# Patient Record
Sex: Female | Born: 1935 | Race: White | Hispanic: No | Smoking: Never smoker
Health system: Southern US, Community
[De-identification: ages and names within clinical notes are randomized; demographics above are authoritative.]

## PROBLEM LIST (undated history)

## (undated) DIAGNOSIS — D649 Anemia, unspecified: Secondary | ICD-10-CM

## (undated) DIAGNOSIS — I10 Essential (primary) hypertension: Secondary | ICD-10-CM

## (undated) DIAGNOSIS — I739 Peripheral vascular disease, unspecified: Secondary | ICD-10-CM

## (undated) DIAGNOSIS — C50919 Malignant neoplasm of unspecified site of unspecified female breast: Secondary | ICD-10-CM

## (undated) DIAGNOSIS — I509 Heart failure, unspecified: Secondary | ICD-10-CM

## (undated) DIAGNOSIS — C801 Malignant (primary) neoplasm, unspecified: Secondary | ICD-10-CM

## (undated) HISTORY — PX: REPLACEMENT TOTAL KNEE BILATERAL: SUR1225

## (undated) HISTORY — DX: Peripheral vascular disease, unspecified: I73.9

## (undated) HISTORY — PX: BREAST LUMPECTOMY: SHX2

## (undated) HISTORY — DX: Anemia, unspecified: D64.9

---

## 2016-06-22 ENCOUNTER — Encounter (HOSPITAL_COMMUNITY): Payer: Self-pay | Admitting: Emergency Medicine

## 2016-06-22 ENCOUNTER — Emergency Department (HOSPITAL_COMMUNITY): Payer: Medicare Other

## 2016-06-22 ENCOUNTER — Inpatient Hospital Stay (HOSPITAL_COMMUNITY)
Admission: EM | Admit: 2016-06-22 | Discharge: 2016-06-27 | DRG: 689 | Disposition: A | Discharge status: Home / Self Care | Payer: Medicare Other | Attending: Family Medicine | Admitting: Family Medicine

## 2016-06-22 DIAGNOSIS — M9711XD Periprosthetic fracture around internal prosthetic right knee joint, subsequent encounter: Secondary | ICD-10-CM | POA: Diagnosis not present

## 2016-06-22 DIAGNOSIS — Z8679 Personal history of other diseases of the circulatory system: Secondary | ICD-10-CM

## 2016-06-22 DIAGNOSIS — D649 Anemia, unspecified: Secondary | ICD-10-CM

## 2016-06-22 DIAGNOSIS — I5030 Unspecified diastolic (congestive) heart failure: Secondary | ICD-10-CM | POA: Diagnosis present

## 2016-06-22 DIAGNOSIS — Z96653 Presence of artificial knee joint, bilateral: Secondary | ICD-10-CM | POA: Diagnosis present

## 2016-06-22 DIAGNOSIS — B965 Pseudomonas (aeruginosa) (mallei) (pseudomallei) as the cause of diseases classified elsewhere: Secondary | ICD-10-CM | POA: Diagnosis present

## 2016-06-22 DIAGNOSIS — L89610 Pressure ulcer of right heel, unstageable: Secondary | ICD-10-CM | POA: Diagnosis present

## 2016-06-22 DIAGNOSIS — R0602 Shortness of breath: Secondary | ICD-10-CM

## 2016-06-22 DIAGNOSIS — I10 Essential (primary) hypertension: Secondary | ICD-10-CM | POA: Diagnosis present

## 2016-06-22 DIAGNOSIS — Z7983 Long term (current) use of bisphosphonates: Secondary | ICD-10-CM | POA: Diagnosis not present

## 2016-06-22 DIAGNOSIS — I82409 Acute embolism and thrombosis of unspecified deep veins of unspecified lower extremity: Secondary | ICD-10-CM | POA: Diagnosis present

## 2016-06-22 DIAGNOSIS — Z7982 Long term (current) use of aspirin: Secondary | ICD-10-CM | POA: Diagnosis not present

## 2016-06-22 DIAGNOSIS — Z882 Allergy status to sulfonamides status: Secondary | ICD-10-CM

## 2016-06-22 DIAGNOSIS — Y92129 Unspecified place in nursing home as the place of occurrence of the external cause: Secondary | ICD-10-CM | POA: Diagnosis not present

## 2016-06-22 DIAGNOSIS — T404X5A Adverse effect of other synthetic narcotics, initial encounter: Secondary | ICD-10-CM | POA: Diagnosis present

## 2016-06-22 DIAGNOSIS — S7291XA Unspecified fracture of right femur, initial encounter for closed fracture: Secondary | ICD-10-CM | POA: Diagnosis present

## 2016-06-22 DIAGNOSIS — Z86718 Personal history of other venous thrombosis and embolism: Secondary | ICD-10-CM | POA: Diagnosis not present

## 2016-06-22 DIAGNOSIS — N3000 Acute cystitis without hematuria: Secondary | ICD-10-CM | POA: Diagnosis not present

## 2016-06-22 DIAGNOSIS — L89893 Pressure ulcer of other site, stage 3: Secondary | ICD-10-CM | POA: Diagnosis present

## 2016-06-22 DIAGNOSIS — Z79899 Other long term (current) drug therapy: Secondary | ICD-10-CM

## 2016-06-22 DIAGNOSIS — I70233 Atherosclerosis of native arteries of right leg with ulceration of ankle: Secondary | ICD-10-CM | POA: Diagnosis present

## 2016-06-22 DIAGNOSIS — Z880 Allergy status to penicillin: Secondary | ICD-10-CM

## 2016-06-22 DIAGNOSIS — Z853 Personal history of malignant neoplasm of breast: Secondary | ICD-10-CM | POA: Diagnosis not present

## 2016-06-22 DIAGNOSIS — D72829 Elevated white blood cell count, unspecified: Secondary | ICD-10-CM

## 2016-06-22 DIAGNOSIS — I82403 Acute embolism and thrombosis of unspecified deep veins of lower extremity, bilateral: Secondary | ICD-10-CM | POA: Diagnosis present

## 2016-06-22 DIAGNOSIS — D638 Anemia in other chronic diseases classified elsewhere: Secondary | ICD-10-CM | POA: Diagnosis present

## 2016-06-22 DIAGNOSIS — N179 Acute kidney failure, unspecified: Secondary | ICD-10-CM

## 2016-06-22 DIAGNOSIS — N390 Urinary tract infection, site not specified: Secondary | ICD-10-CM | POA: Insufficient documentation

## 2016-06-22 DIAGNOSIS — L89899 Pressure ulcer of other site, unspecified stage: Secondary | ICD-10-CM

## 2016-06-22 DIAGNOSIS — I110 Hypertensive heart disease with heart failure: Secondary | ICD-10-CM | POA: Diagnosis present

## 2016-06-22 DIAGNOSIS — K219 Gastro-esophageal reflux disease without esophagitis: Secondary | ICD-10-CM | POA: Diagnosis present

## 2016-06-22 DIAGNOSIS — G9340 Encephalopathy, unspecified: Secondary | ICD-10-CM | POA: Diagnosis not present

## 2016-06-22 DIAGNOSIS — T148XXA Other injury of unspecified body region, initial encounter: Secondary | ICD-10-CM

## 2016-06-22 DIAGNOSIS — G92 Toxic encephalopathy: Secondary | ICD-10-CM | POA: Diagnosis present

## 2016-06-22 DIAGNOSIS — E875 Hyperkalemia: Secondary | ICD-10-CM | POA: Diagnosis not present

## 2016-06-22 DIAGNOSIS — L8990 Pressure ulcer of unspecified site, unspecified stage: Secondary | ICD-10-CM | POA: Diagnosis present

## 2016-06-22 HISTORY — DX: Malignant neoplasm of unspecified site of unspecified female breast: C50.919

## 2016-06-22 HISTORY — DX: Essential (primary) hypertension: I10

## 2016-06-22 HISTORY — DX: Heart failure, unspecified: I50.9

## 2016-06-22 HISTORY — DX: Malignant (primary) neoplasm, unspecified: C80.1

## 2016-06-22 LAB — SALICYLATE LEVEL: Salicylate Lvl: <7 mg/dL (ref 2.8–30.0)

## 2016-06-22 LAB — URINALYSIS, ROUTINE W REFLEX MICROSCOPIC
Bilirubin Urine: NEGATIVE
Glucose, UA: NEGATIVE mg/dL
Hgb urine dipstick: NEGATIVE
KETONES UR: 5 mg/dL — AB
Nitrite: NEGATIVE
PH: 5 (ref 5.0–8.0)
PROTEIN: 100 mg/dL — AB
Specific Gravity, Urine: 1.019 (ref 1.005–1.030)

## 2016-06-22 LAB — COMPREHENSIVE METABOLIC PANEL
ALT: 16 U/L (ref 14–54)
ANION GAP: 7 (ref 5–15)
AST: 24 U/L (ref 15–41)
Albumin: 2.4 g/dL — ABNORMAL LOW (ref 3.5–5.0)
Alkaline Phosphatase: 122 U/L (ref 38–126)
BUN: 55 mg/dL — ABNORMAL HIGH (ref 6–20)
CO2: 17 mmol/L — ABNORMAL LOW (ref 22–32)
Calcium: 8.4 mg/dL — ABNORMAL LOW (ref 8.9–10.3)
Chloride: 111 mmol/L (ref 101–111)
Creatinine, Ser: 1.59 mg/dL — ABNORMAL HIGH (ref 0.44–1.00)
GFR, EST AFRICAN AMERICAN: 30 mL/min — AB (ref >60)
GFR, EST NON AFRICAN AMERICAN: 26 mL/min — AB (ref >60)
Glucose, Bld: 108 mg/dL — ABNORMAL HIGH (ref 65–99)
Sodium: 135 mmol/L (ref 135–145)
TOTAL PROTEIN: 6.7 g/dL (ref 6.5–8.1)
Total Bilirubin: 0.6 mg/dL (ref 0.3–1.2)

## 2016-06-22 LAB — I-STAT TROPONIN, ED: TROPONIN I, POC: 0.03 ng/mL (ref 0.00–0.08)

## 2016-06-22 LAB — CBC WITH DIFFERENTIAL/PLATELET
BASOS ABS: 0.1 10*3/uL (ref 0.0–0.1)
Basophils Relative: 0 %
EOS PCT: 1 %
Eosinophils Absolute: 0.3 10*3/uL (ref 0.0–0.7)
HEMATOCRIT: 34 % — AB (ref 36.0–46.0)
Hemoglobin: 10.4 g/dL — ABNORMAL LOW (ref 12.0–15.0)
LYMPHS PCT: 7 %
Lymphs Abs: 1.5 10*3/uL (ref 0.7–4.0)
MCH: 27.6 pg (ref 26.0–34.0)
MCHC: 30.6 g/dL (ref 30.0–36.0)
MCV: 90.2 fL (ref 78.0–100.0)
Monocytes Absolute: 2.4 10*3/uL — ABNORMAL HIGH (ref 0.1–1.0)
Monocytes Relative: 11 %
NEUTROS ABS: 18.1 10*3/uL — AB (ref 1.7–7.7)
Neutrophils Relative %: 81 %
PLATELETS: 358 10*3/uL (ref 150–400)
RBC: 3.77 MIL/uL — AB (ref 3.87–5.11)
RDW: 14.7 % (ref 11.5–15.5)
WBC: 22.4 10*3/uL — AB (ref 4.0–10.5)

## 2016-06-22 LAB — I-STAT CHEM 8, ED
BUN: 57 mg/dL — AB (ref 6–20)
CHLORIDE: 110 mmol/L (ref 101–111)
CREATININE: 1.5 mg/dL — AB (ref 0.44–1.00)
Calcium, Ion: 1.19 mmol/L (ref 1.15–1.40)
Glucose, Bld: 117 mg/dL — ABNORMAL HIGH (ref 65–99)
HCT: 35 % — ABNORMAL LOW (ref 36.0–46.0)
Hemoglobin: 11.9 g/dL — ABNORMAL LOW (ref 12.0–15.0)
POTASSIUM: 6.7 mmol/L — AB (ref 3.5–5.1)
Sodium: 136 mmol/L (ref 135–145)
TCO2: 18 mmol/L (ref 0–100)

## 2016-06-22 LAB — PROTIME-INR
INR: 1.22
PROTHROMBIN TIME: 15.5 s — AB (ref 11.4–15.2)

## 2016-06-22 LAB — I-STAT CG4 LACTIC ACID, ED: Lactic Acid, Venous: 1.37 mmol/L (ref 0.5–1.9)

## 2016-06-22 LAB — POTASSIUM: POTASSIUM: 5.5 mmol/L — AB (ref 3.5–5.1)

## 2016-06-22 LAB — RAPID URINE DRUG SCREEN, HOSP PERFORMED
Amphetamines: NOT DETECTED
Barbiturates: NOT DETECTED
Benzodiazepines: NOT DETECTED
COCAINE: NOT DETECTED
OPIATES: NOT DETECTED
Tetrahydrocannabinol: NOT DETECTED

## 2016-06-22 LAB — ACETAMINOPHEN LEVEL: Acetaminophen (Tylenol), Serum: <10 ug/mL — ABNORMAL LOW (ref 10–30)

## 2016-06-22 LAB — GLUCOSE, CAPILLARY: GLUCOSE-CAPILLARY: 154 mg/dL — AB (ref 65–99)

## 2016-06-22 MED ORDER — OXYBUTYNIN CHLORIDE ER 5 MG PO TB24
5.0000 mg | ORAL_TABLET | Freq: Every day | ORAL | Status: DC
Start: 2016-06-23 — End: 2016-06-27
  Administered 2016-06-24 – 2016-06-27 (×4): 5 mg via ORAL
  Filled 2016-06-22 (×5): qty 1

## 2016-06-22 MED ORDER — PRO-STAT SUGAR FREE PO LIQD
30.0000 mL | Freq: Three times a day (TID) | ORAL | Status: DC
  Administered 2016-06-23 – 2016-06-27 (×9): 30 mL via ORAL
  Filled 2016-06-22 (×8): qty 30

## 2016-06-22 MED ORDER — FLUTICASONE PROPIONATE (INHAL) 50 MCG/BLIST IN AEPB: 2.0000 | INHALATION_SPRAY | Freq: Two times a day (BID) | RESPIRATORY_TRACT | Status: DC

## 2016-06-22 MED ORDER — ONDANSETRON HCL 4 MG PO TABS: 4.0000 mg | ORAL_TABLET | Freq: Four times a day (QID) | ORAL | Status: DC | PRN

## 2016-06-22 MED ORDER — SODIUM POLYSTYRENE SULFONATE 15 GM/60ML PO SUSP
30.0000 g | Freq: Once | ORAL | Status: DC
  Filled 2016-06-22: qty 120

## 2016-06-22 MED ORDER — SODIUM CHLORIDE 0.9 % IV BOLUS (SEPSIS)
1000.0000 mL | Freq: Once | INTRAVENOUS | Status: AC
  Administered 2016-06-22: 1000 mL via INTRAVENOUS

## 2016-06-22 MED ORDER — SODIUM CHLORIDE 0.9% FLUSH
3.0000 mL | Freq: Two times a day (BID) | INTRAVENOUS | Status: DC
  Administered 2016-06-23 – 2016-06-27 (×8): 3 mL via INTRAVENOUS

## 2016-06-22 MED ORDER — ENOXAPARIN SODIUM 40 MG/0.4ML ~~LOC~~ SOLN
40.0000 mg | SUBCUTANEOUS | Status: DC
  Administered 2016-06-22: 40 mg via SUBCUTANEOUS
  Filled 2016-06-22: qty 0.4

## 2016-06-22 MED ORDER — INSULIN ASPART 100 UNIT/ML ~~LOC~~ SOLN
6.0000 [IU] | Freq: Once | SUBCUTANEOUS | Status: AC
Start: 2016-06-22 — End: 2016-06-22
  Administered 2016-06-22: 6 [IU] via INTRAVENOUS
  Filled 2016-06-22: qty 1

## 2016-06-22 MED ORDER — SODIUM CHLORIDE 0.9 % IV SOLN
1.0000 g | Freq: Once | INTRAVENOUS | Status: AC
  Administered 2016-06-22: 1 g via INTRAVENOUS
  Filled 2016-06-22: qty 10

## 2016-06-22 MED ORDER — PANTOPRAZOLE SODIUM 40 MG PO TBEC
40.0000 mg | DELAYED_RELEASE_TABLET | Freq: Every day | ORAL | Status: DC
  Administered 2016-06-24 – 2016-06-27 (×4): 40 mg via ORAL
  Filled 2016-06-22 (×4): qty 1

## 2016-06-22 MED ORDER — ACETAMINOPHEN 325 MG PO TABS
650.0000 mg | ORAL_TABLET | Freq: Four times a day (QID) | ORAL | Status: DC | PRN
  Administered 2016-06-25 – 2016-06-26 (×3): 650 mg via ORAL
  Filled 2016-06-22 (×3): qty 2

## 2016-06-22 MED ORDER — AZTREONAM 1 G IJ SOLR
1.0000 g | Freq: Three times a day (TID) | INTRAMUSCULAR | Status: DC
  Filled 2016-06-22 (×2): qty 1

## 2016-06-22 MED ORDER — BUDESONIDE 0.25 MG/2ML IN SUSP
0.2500 mg | Freq: Two times a day (BID) | RESPIRATORY_TRACT | Status: DC
  Administered 2016-06-22 – 2016-06-27 (×10): 0.25 mg via RESPIRATORY_TRACT
  Filled 2016-06-22 (×10): qty 2

## 2016-06-22 MED ORDER — ONDANSETRON HCL 4 MG/2ML IJ SOLN: 4.0000 mg | Freq: Four times a day (QID) | INTRAMUSCULAR | Status: DC | PRN

## 2016-06-22 MED ORDER — SODIUM CHLORIDE 0.9 % IV SOLN
INTRAVENOUS | Status: DC
  Administered 2016-06-22 – 2016-06-24 (×3): via INTRAVENOUS

## 2016-06-22 MED ORDER — DEXTROSE 5 % IV SOLN
500.0000 mg | Freq: Three times a day (TID) | INTRAVENOUS | Status: DC
  Administered 2016-06-22 – 2016-06-25 (×8): 500 mg via INTRAVENOUS
  Filled 2016-06-22 (×12): qty 0.5

## 2016-06-22 MED ORDER — ACETAMINOPHEN 650 MG RE SUPP: 650.0000 mg | Freq: Four times a day (QID) | RECTAL | Status: DC | PRN

## 2016-06-22 MED ORDER — ALBUTEROL SULFATE (2.5 MG/3ML) 0.083% IN NEBU
2.5000 mg | INHALATION_SOLUTION | RESPIRATORY_TRACT | Status: DC | PRN
Start: 2016-06-22 — End: 2016-06-27

## 2016-06-22 MED ORDER — SODIUM CHLORIDE 0.9 % IV BOLUS (SEPSIS)
250.0000 mL | Freq: Once | INTRAVENOUS | Status: DC
  Administered 2016-06-22: 250 mL via INTRAVENOUS

## 2016-06-22 MED ORDER — SODIUM CHLORIDE 0.9 % IV BOLUS (SEPSIS)
250.0000 mL | Freq: Once | INTRAVENOUS | Status: AC
  Administered 2016-06-22: 250 mL via INTRAVENOUS

## 2016-06-22 MED ORDER — ASPIRIN 325 MG PO TABS
325.0000 mg | ORAL_TABLET | Freq: Every day | ORAL | Status: DC
  Administered 2016-06-24 – 2016-06-27 (×4): 325 mg via ORAL
  Filled 2016-06-22 (×4): qty 1

## 2016-06-22 MED ORDER — DEXTROSE 50 % IV SOLN
1.0000 | Freq: Once | INTRAVENOUS | Status: AC
  Administered 2016-06-22: 50 mL via INTRAVENOUS
  Filled 2016-06-22: qty 50

## 2016-06-22 MED ORDER — ALBUTEROL SULFATE (2.5 MG/3ML) 0.083% IN NEBU
10.0000 mg | INHALATION_SOLUTION | Freq: Once | RESPIRATORY_TRACT | Status: AC
  Administered 2016-06-22: 10 mg via RESPIRATORY_TRACT
  Filled 2016-06-22: qty 12

## 2016-06-22 NOTE — ED Triage Notes (Signed)
Per EMS, patient was coming from [facility] to [place] home.  When arriving at [facility], patient was talking then acted like she dozed off and was hard to arouse.  When EMS arrived, patient was talking some.  Pupils were pinpoint.  On way from [facility] to [place], patient was given unknown dose of tramadol.  Patient takes 100mg  of tramadol a day.  En route, patient give 2 of Narcan.  Since then, patient more talkative and awake.  Per EMS and [employer], family smelled of marijana.  BP 114/46, HR 66, 95% RA, CBG 151.  20G Left wrist.

## 2016-06-22 NOTE — ED Notes (Signed)
Report attempted 

## 2016-06-22 NOTE — Progress Notes (Signed)
Pharmacy Antibiotic Note Cindy Mills is a 83 y.o. female admitted on 06/22/2016 with UTI.  Pharmacy has been consulted for Azactam dosing.  Pt has documented PCN allergy and per family is allergic to all PCN agents with unknown severity of allergy/reaction.   Plan: Azactam 500 mg IV every 8 hours   Height: 5\' 6"  (167.6 cm) Weight: 180 lb (81.6 kg) IBW/kg (Calculated) : 59.3  Temp (24hrs), Avg:98.5 F (36.9 C), Min:97.9 F (36.6 C), Max:99.1 F (37.3 C)   Recent Labs Lab 06/22/16 1620 06/22/16 1823 06/22/16 1824  WBC 22.4*  --   --   CREATININE 1.59* 1.50*  --   LATICACIDVEN  --   --  1.37    Estimated Creatinine Clearance: 23.6 mL/min (by C-G formula based on SCr of 1.5 mg/dL (H)).    Allergies  Allergen Reactions  . Penicillins Anaphylaxis    Family does not have any additional information  . Amoxicillin Other (See Comments)    Unknown reaction per MAR  . Sulfa Antibiotics Other (See Comments)    Antimicrobials this admission: 1/1 Azactam >>   Microbiology results: 1/1 BCx: px 1/1 UCx: px    Thank you for allowing pharmacy to be a part of this patient's care.  Vincenza Hews, PharmD, BCPS 06/22/2016, 6:45 PM Pager: [phone]

## 2016-06-22 NOTE — ED Notes (Signed)
Pt in X ray

## 2016-06-22 NOTE — ED Provider Notes (Signed)
[facility] DEPT Provider Note   CSN: [id number] Arrival date & time: 06/22/16  1547     History   Chief Complaint Chief Complaint  Patient presents with  . Fatigue    HPI Cindy Mills is a 83 y.o. female.  HPI   Cindy Mills is a 83 y.o. female, with a history of HTN and CHF, presenting to the ED with Altered mental status noticed today. Unknown last normal. Family denies history of dementia or confusion. Patient's son states that the patient was being cared for at a nursing home in [place]. She was there for rehabilitation following a distal femur fracture that occurred on Thanksgiving this year. They felt that she was not being adequately cared for with suspicion of over medicating her and neglecting her. Patient was being transferred from the nursing home in [place] to [place] home. Patient has been more somnolent than normal today. Upon arrival at [facility] today, she was noted to be semiconscious and difficult to wake up. Patient received 2 mg Narcan by EMS and patient became spontaneously alert.  Patient has some confusion as to the current situation and current location. Family states this is abnormal for her. The [facility] home has been administering 100 mg of tramadol daily as well as with the family called "some type of opioid patch." This was verified to be 25 mcg Fentanyl patches.  Family also endorses worsening ulcers on the right lower extremity. These  Have been complicated by a DVT that was found on Thanksgiving.  Ether Griffins, son and POA, and patient's daughter in law are at the bedside. [phone]     Past Medical History:  Diagnosis Date  . Breast cancer ([place])   . Cancer ([place])   . CHF (congestive heart failure) ([place])   . Hypertension     There are no active problems to display for this patient.   Past Surgical History:  Procedure Laterality Date  . BREAST LUMPECTOMY    . REPLACEMENT TOTAL KNEE BILATERAL      OB History    No data available       Home Medications    Prior to Admission medications   Medication Sig Start Date End Date Taking? Authorizing Provider  alendronate (FOSAMAX) 70 MG tablet Take 70 mg by mouth every Sunday. Take with a full glass of water on an empty stomach.   Yes Historical Provider, MD  Amino Acids-Protein Hydrolys (FEEDING SUPPLEMENT, PRO-STAT SUGAR FREE 64,) LIQD Take 30 mLs by mouth 3 (three) times daily with meals.   Yes Historical Provider, MD  aspirin 325 MG tablet Take 325 mg by mouth daily.   Yes Historical Provider, MD  cholecalciferol (VITAMIN D) 1000 units tablet Take 1,000 Units by mouth daily.   Yes Historical Provider, MD  cloNIDine (CATAPRES - DOSED IN MG/24 HR) 0.2 mg/24hr patch Place 0.2 mg onto the skin every Thursday.   Yes Historical Provider, MD  docusate sodium (COLACE) 100 MG capsule Take 100 mg by mouth daily.   Yes Historical Provider, MD  fluticasone (FLOVENT DISKUS) 50 MCG/BLIST diskus inhaler Inhale 2 puffs into the lungs every 12 (twelve) hours. Rinse mouth with water after use. Do not swallow.   Yes Historical Provider, MD  furosemide (LASIX) 20 MG tablet Take 20 mg by mouth daily.   Yes Historical Provider, MD  guaiFENesin (MUCINEX) 600 MG 12 hr tablet Take 600 mg by mouth every 12 (twelve) hours.   Yes Historical Provider, MD  lisinopril (PRINIVIL,ZESTRIL)  10 MG tablet Take 10 mg by mouth See admin instructions. Take 1 tablet (10 mg) by mouth every 12 hours - hold for SBP <110   Yes Historical Provider, MD  Multiple Vitamin (MULTIVITAMIN WITH MINERALS) TABS tablet Take 1 tablet by mouth daily.   Yes Historical Provider, MD  omeprazole (PRILOSEC) 20 MG capsule Take 20 mg by mouth daily at 6 (six) AM.   Yes Historical Provider, MD  ondansetron (ZOFRAN-ODT) 4 MG disintegrating tablet Take 4 mg by mouth every 4 (four) hours as needed for nausea or vomiting.   Yes Historical Provider, MD  oxybutynin (DITROPAN-XL) 5 MG 24 hr tablet Take 5 mg by mouth daily.    Yes Historical Provider, MD  potassium chloride (KLOR-CON) 20 MEQ packet Take 20 mEq by mouth every 12 (twelve) hours.   Yes Historical Provider, MD  Probiotic Product (PROBIOTIC PO) Take 1 capsule by mouth every 12 (twelve) hours. Probiotic Formula Veggie Caps 1B-250 mg   Yes Historical Provider, MD  promethazine (PHENERGAN) 25 MG suppository Place 25 mg rectally every 4 (four) hours as needed for nausea or vomiting.   Yes Historical Provider, MD  traMADol (ULTRAM) 50 MG tablet Take 50-100 mg by mouth every 4 (four) hours as needed (pain).    Yes Historical Provider, MD  vitamin C (ASCORBIC ACID) 500 MG tablet Take 500 mg by mouth every 12 (twelve) hours.   Yes Historical Provider, MD    Family History History reviewed. No pertinent family history.  Social History Social History  Substance Use Topics  . Smoking status: Never Smoker  . Smokeless tobacco: Never Used  . Alcohol use No     Allergies   Penicillins; Amoxicillin; and Sulfa antibiotics   Review of Systems Review of Systems  Constitutional: Negative for chills and fever.  Respiratory: Negative for cough and shortness of breath.   Cardiovascular: Negative for chest pain.  Gastrointestinal: Negative for nausea and vomiting.  Genitourinary: Negative for hematuria.  Neurological: Negative for dizziness, light-headedness and headaches.  Psychiatric/Behavioral: Positive for confusion.  All other systems reviewed and are negative.    Physical Exam Updated Vital Signs BP (!) 106/47 (BP Location: Right Arm)   Pulse 63   Temp 97.9 F (36.6 C) (Oral)   Resp 16   Ht 5\' 6"  (1.676 m)   Wt 81.6 kg   SpO2 100%   BMI 29.05 kg/m   Physical Exam  Constitutional: She appears well-developed and well-nourished. No distress.  HENT:  Head: Normocephalic and atraumatic.  Dry mucous membranes  Eyes: Conjunctivae and EOM are normal.  Constricted pupils bilaterally.  Neck: Normal range of motion. Neck supple.  Cardiovascular:  Normal rate, regular rhythm, normal heart sounds and intact distal pulses.   Pulmonary/Chest: Effort normal and breath sounds normal. No respiratory distress.  Abdominal: Soft. There is no tenderness. There is no guarding.  Musculoskeletal: She exhibits edema and tenderness.  Edema and tenderness surrounding the ulcers on the lower right leg.  Lymphadenopathy:    She has no cervical adenopathy.  Neurological: She is alert.  Patient is oriented to self and time, which family states is abnormal for her.  No sensory deficits. Strength 5/5 in all extremities. Coordination intact. Cranial nerves III-XII grossly intact. No facial droop.   Skin: Skin is warm and dry. Capillary refill takes less than 2 seconds. She is not diaphoretic.  Stage III-IV ulcer over anterior right distal tibia Stage II ulcer right lateral heel Stage II ulcer posterior right heel All with  some surrounding erythema.  Nursing note and vitals reviewed.    ED Treatments / Results  Labs (all labs ordered are listed, but only abnormal results are displayed) Labs Reviewed  COMPREHENSIVE METABOLIC PANEL - Abnormal; Notable for the following:       Result Value   Potassium >7.5 (*)    CO2 17 (*)    Glucose, Bld 108 (*)    BUN 55 (*)    Creatinine, Ser 1.59 (*)    Calcium 8.4 (*)    Albumin 2.4 (*)    GFR calc non Af Amer 26 (*)    GFR calc Af Amer 30 (*)    All other components within normal limits  CBC WITH DIFFERENTIAL/PLATELET - Abnormal; Notable for the following:    WBC 22.4 (*)    RBC 3.77 (*)    Hemoglobin 10.4 (*)    HCT 34.0 (*)    Neutro Abs 18.1 (*)    Monocytes Absolute 2.4 (*)    All other components within normal limits  ACETAMINOPHEN LEVEL - Abnormal; Notable for the following:    Acetaminophen (Tylenol), Serum <10 (*)    All other components within normal limits  URINALYSIS, ROUTINE W REFLEX MICROSCOPIC - Abnormal; Notable for the following:    Color, Urine AMBER (*)    APPearance CLOUDY (*)      Ketones, ur 5 (*)    Protein, ur 100 (*)    Leukocytes, UA MODERATE (*)    Bacteria, UA MANY (*)    Squamous Epithelial / LPF 0-5 (*)    All other components within normal limits  I-STAT CHEM 8, ED - Abnormal; Notable for the following:    Potassium 6.7 (*)    BUN 57 (*)    Creatinine, Ser 1.50 (*)    Glucose, Bld 117 (*)    Hemoglobin 11.9 (*)    HCT 35.0 (*)    All other components within normal limits  CULTURE, BLOOD (ROUTINE X 2)  CULTURE, BLOOD (ROUTINE X 2)  URINE CULTURE  AEROBIC CULTURE (SUPERFICIAL SPECIMEN)  SALICYLATE LEVEL  RAPID URINE DRUG SCREEN, HOSP PERFORMED  I-STAT TROPOININ, ED  I-STAT CG4 LACTIC ACID, ED    EKG  EKG Interpretation None       Radiology Dg Chest 2 View  Result Date: 06/22/2016 CLINICAL DATA:  History of congestive heart failure and hypertension. Generalized right lower leg pain and heel pain. EXAM: CHEST  2 VIEW COMPARISON:  None. FINDINGS: The aorta is tortuous. The heart size is enlarged. There is a 6 mm nodule in the right upper lobe. There is no focal pneumonia, pulmonary edema, or pleural effusion. There is kyphosis of spine. Degenerative joint changes of bilateral shoulders and the spine are identified. IMPRESSION: Cardiomegaly.  No focal abnormality identified within the lungs. 6 mm nodule in the right upper lobe. Further evaluation with a chest CT on outpatient basis is recommended. Electronically Signed   By: Abelardo Diesel M.D.   On: 06/22/2016 17:18   Dg Tibia/fibula Right  Result Date: 06/22/2016 CLINICAL DATA:  Right leg and heel pain. EXAM: RIGHT TIBIA AND FIBULA - 2 VIEW COMPARISON:  None. FINDINGS: There is extensive comminuted displaced fracture of the distal femur. There is displaced fracture of the proximal fibula. There is probable fracture/ knee replacement loosening of the proximal anterior aspect of the proximal tibia. There is no bony destruction to suggest osteomyelitis. There is anterior ulceration of the lower right  anterior leg. IMPRESSION: Fractures of the distal  femur, proximal fibula and probable/ knee replacement loosening of the proximal anterior aspect of the proximal tibia. Anterior ulceration of the right lower leg. No evidence of osteomyelitis. Electronically Signed   By: Abelardo Diesel M.D.   On: 06/22/2016 17:25   Dg Foot Complete Right  Result Date: 06/22/2016 CLINICAL DATA:  Ulcers on the right leg and right heel. EXAM: RIGHT FOOT COMPLETE - 3+ VIEW COMPARISON:  None. FINDINGS: There is curvilinear lucency at the base of the third and fourth metatarsals suspicious for fracture. There is cortical discontinuity in the distal aspect of the first metatarsal suspicious for fracture. There is no bony destruction to suggest osteomyelitis. There is diffuse osteopenia. Degenerative joint changes throughout the right foot are noted. IMPRESSION: No evidence of osteomyelitis. Lucency at the base of the third and fourth metatarsals and in the distal aspect of the first metatarsal suspicious for fracture. Electronically Signed   By: Abelardo Diesel M.D.   On: 06/22/2016 17:21    Procedures Procedures (including critical care time)  CRITICAL CARE Performed by: Keymani Mclean C Emmaleigh Longo Total critical care time: 35 minutes Critical care time was exclusive of separately billable procedures and treating other patients. Critical care was necessary to treat or prevent imminent or life-threatening deterioration. Critical care was time spent personally by me on the following activities: development of treatment plan with patient and/or surrogate as well as nursing, discussions with consultants, evaluation of patient's response to treatment, examination of patient, obtaining history from patient or surrogate, ordering and performing treatments and interventions, ordering and review of laboratory studies, ordering and review of radiographic studies, pulse oximetry and re-evaluation of patient's condition.   Medications Ordered in  ED Medications  aztreonam (AZACTAM) 500 mg in dextrose 5 % 50 mL IVPB (500 mg Intravenous New Bag/Given 06/22/16 1922)  sodium chloride 0.9 % bolus 1,000 mL (1,000 mLs Intravenous New Bag/Given 06/22/16 1924)  insulin aspart (novoLOG) injection 6 Units (not administered)  dextrose 50 % solution 50 mL (not administered)  albuterol (PROVENTIL) (2.5 MG/3ML) 0.083% nebulizer solution 10 mg (not administered)  calcium gluconate 1 g in sodium chloride 0.9 % 100 mL IVPB (not administered)  sodium chloride 0.9 % bolus 250 mL (0 mLs Intravenous Stopped 06/22/16 1753)     Initial Impression / Assessment and Plan / ED Course  I have reviewed the triage vital signs and the nursing notes.  Pertinent labs & imaging results that were available during my care of the patient were reviewed by me and considered in my medical decision making (see chart for details).  Clinical Course      Patient presents with some change in mental status. Initial decreased level of consciousness suspected to be due to overmedicating with opioids by the nursing home staff. Dehydration and UTI noted and addressed. Hyperkalemia noted with peaked T waves on EKG. Hyperkalemia protocol initiated. Although patient has an infection, her elevated Creatinine and lower blood pressure is suspected to be due to dehydration. I do not think that this patient is septic at the present time.  6:39 PM Spoke with Jonni Sanger, pharmacist, who recommended Azactam over Cefepime due to pateint's PCN allergy.  Most serious of the lower right leg ulcers was cultured. When the family was asked about the patient's history of CHF, they state that they do not know why she was diagnosed with this, but when her heart was reevaluated this past November while in the hospital, they were told that she had an EF of 78%.    Findings  and plan of care discussed with Davonna Belling, MD. Dr. Alvino Chapel personally evaluated and examined this patient.  7:15 PM Spoke with Dr.  Tamala Julian, hospitalist, who agreed to admit the patient stepdown inpatient status.   Vitals:   06/22/16 1551 06/22/16 1554 06/22/16 1600 06/22/16 1615  BP:  (!) 106/47 (!) 100/53 (!) 99/51  Pulse:  63 63 63  Resp:  16 21 21   Temp:  97.9 F (36.6 C)    TempSrc:  Oral    SpO2:  100% 100% 100%  Weight: 81.6 kg     Height: 5\' 6"  (1.676 m)      Vitals:   06/22/16 1745 06/22/16 1753 06/22/16 1800 06/22/16 1815  BP: (!) 114/49  (!) 111/49 (!) 115/45  Pulse: 63  62 64  Resp: 18  15 16   Temp:  99.1 F (37.3 C)    TempSrc:  Rectal    SpO2: 99%  100% 100%  Weight:      Height:         Final Clinical Impressions(s) / ED Diagnoses   Final diagnoses:  Acute cystitis without hematuria  Hyperkalemia    New Prescriptions New Prescriptions   No medications on file     Layla Maw 06/22/16 1925    Davonna Belling, MD 06/23/16 0010

## 2016-06-22 NOTE — ED Notes (Signed)
Patient has three ulcers on right leg.  One on right heel is stage 2.  One on back of lower leg stage 1. And One on front of right leg stage 3.

## 2016-06-22 NOTE — ED Notes (Signed)
Unsuccessful blood draw for 2nd set of blood cultures...QNS

## 2016-06-22 NOTE — H&P (Signed)
History and Physical    [name] [id number] DOB: March 29, 1918 DOA: 06/22/2016  Referring MD/NP/PA: Arlean Hopping, PA-C PCP: No primary care provider on file.  Patient coming from:   Chief Complaint: altered mental status  HPI: Cindy Mills is a 83 y.o. female with medical history significant of HTN, CHF, breast cancer, and dvt; who presented with altered mental status. Family who is present at bedside provide history as patient is currently confused. Patient was previously being cared for at [facility] assisted living facility. Patient had fallen at Thanksgiving and fractured her right leg. During the hospitalization at [facility] she was also found to have DVT for which it appears that the patient was not placed on any anticoagulation. She was evaluated by orthopedic surgery, but was unable to have surgical correction due to issues with the right knee. Following the hospitalization she was transferred back to [facility] to the nursing home part, and was placed on pain medications tramadol and fentanyl. Family notes that the facility continue to increase dosage of these medications and as a result the patient had declining function, mental status, and issues with dehydration. They requested multiple times for pain medications to be decreased. Family after evaluating overall care decided to transfer her from [facility] to [place] home. Family notes that there was found to fentanyl patch discontinued but she had been increased from tramadol 25 mg 100 mg for pain. However, after being transferred to [place] home today patient was found to be semi-conscious and difficult to wake.  EMS was called prior to her being checked in to the nursing home. En route they had to administered Narcan with some improvement of patient mental status. Son who is also patient's POA  changed the DO NOT RESUSCITATE status and wants her to be a full code.  ED Course: Upon  admission the emergency room patient was seen to be afebrile with all other vital signs relatively within normal limits. Laboratory revealed WBC 22.4, hemoglobin 10.4, potassium>7.5, BUN 55, creatinine 1.59, lactic acid 1.37, and all other lab values relatively within normal limits. UA was positive for  signs of infection. Patient was given 1 L of NS IV fluids and antibiotics of aztreonam for suspected UTI. TRH called to admit to stepdown.  Review of Systems: Unable to obtain secondary to patient condition  Past Medical History:  Diagnosis Date  . Breast cancer ([place])   . Cancer ([place])   . CHF (congestive heart failure) ([place])   . Hypertension     Past Surgical History:  Procedure Laterality Date  . BREAST LUMPECTOMY    . REPLACEMENT TOTAL KNEE BILATERAL       reports that she has never smoked. She has never used smokeless tobacco. She reports that she does not drink alcohol or use drugs.  Allergies  Allergen Reactions  . Penicillins Anaphylaxis    Family does not have any additional information  . Amoxicillin Other (See Comments)    Unknown reaction per MAR  . Sulfa Antibiotics Other (See Comments)    History reviewed. No pertinent family history.  Prior to Admission medications   Medication Sig Start Date End Date Taking? Authorizing Provider  alendronate (FOSAMAX) 70 MG tablet Take 70 mg by mouth every Sunday. Take with a full glass of water on an empty stomach.   Yes Historical Provider, MD  Amino Acids-Protein Hydrolys (FEEDING SUPPLEMENT, PRO-STAT SUGAR FREE 64,) LIQD Take 30 mLs by mouth 3 (three) times daily  with meals.   Yes Historical Provider, MD  aspirin 325 MG tablet Take 325 mg by mouth daily.   Yes Historical Provider, MD  cholecalciferol (VITAMIN D) 1000 units tablet Take 1,000 Units by mouth daily.   Yes Historical Provider, MD  cloNIDine (CATAPRES - DOSED IN MG/24 HR) 0.2 mg/24hr patch Place 0.2 mg onto the skin every Thursday.   Yes Historical Provider, MD    docusate sodium (COLACE) 100 MG capsule Take 100 mg by mouth daily.   Yes Historical Provider, MD  fluticasone (FLOVENT DISKUS) 50 MCG/BLIST diskus inhaler Inhale 2 puffs into the lungs every 12 (twelve) hours. Rinse mouth with water after use. Do not swallow.   Yes Historical Provider, MD  furosemide (LASIX) 20 MG tablet Take 20 mg by mouth daily.   Yes Historical Provider, MD  guaiFENesin (MUCINEX) 600 MG 12 hr tablet Take 600 mg by mouth every 12 (twelve) hours.   Yes Historical Provider, MD  lisinopril (PRINIVIL,ZESTRIL) 10 MG tablet Take 10 mg by mouth See admin instructions. Take 1 tablet (10 mg) by mouth every 12 hours - hold for SBP <110   Yes Historical Provider, MD  Multiple Vitamin (MULTIVITAMIN WITH MINERALS) TABS tablet Take 1 tablet by mouth daily.   Yes Historical Provider, MD  omeprazole (PRILOSEC) 20 MG capsule Take 20 mg by mouth daily at 6 (six) AM.   Yes Historical Provider, MD  ondansetron (ZOFRAN-ODT) 4 MG disintegrating tablet Take 4 mg by mouth every 4 (four) hours as needed for nausea or vomiting.   Yes Historical Provider, MD  oxybutynin (DITROPAN-XL) 5 MG 24 hr tablet Take 5 mg by mouth daily.   Yes Historical Provider, MD  potassium chloride (KLOR-CON) 20 MEQ packet Take 20 mEq by mouth every 12 (twelve) hours.   Yes Historical Provider, MD  Probiotic Product (PROBIOTIC PO) Take 1 capsule by mouth every 12 (twelve) hours. Probiotic Formula Veggie Caps 1B-250 mg   Yes Historical Provider, MD  promethazine (PHENERGAN) 25 MG suppository Place 25 mg rectally every 4 (four) hours as needed for nausea or vomiting.   Yes Historical Provider, MD  traMADol (ULTRAM) 50 MG tablet Take 50-100 mg by mouth every 4 (four) hours as needed (pain).    Yes Historical Provider, MD  vitamin C (ASCORBIC ACID) 500 MG tablet Take 500 mg by mouth every 12 (twelve) hours.   Yes Historical Provider, MD    Physical Exam:  Constitutional: Elderly female appears somewhat lethargic, but arousable  and able to follow commands. Vitals:   06/22/16 1745 06/22/16 1753 06/22/16 1800 06/22/16 1815  BP: (!) 114/49  (!) 111/49 (!) 115/45  Pulse: 63  62 64  Resp: 18  15 16   Temp:  99.1 F (37.3 C)    TempSrc:  Rectal    SpO2: 99%  100% 100%  Weight:      Height:       Eyes: PERRL, lids and conjunctivae normal ENMT: Mucous membranes are dry. Posterior pharynx clear of any exudate or lesions.  Neck: normal, supple, no masses, no thyromegaly Respiratory: clear to auscultation bilaterally, no wheezing, no crackles. Normal respiratory effort. No accessory muscle use.  Cardiovascular: Regular rate and rhythm, no murmurs / rubs / gallops. No extremity edema. 2+ pedal pulses. No carotid bruits.  Abdomen: no tenderness, no masses palpated. No hepatosplenomegaly. Bowel sounds positive.  Musculoskeletal: no clubbing / cyanosis. Right leg in a brace. Skin: Ulcerations present on the right leg. Neurologic: CN 2-12 grossly intact. Sensation intact, DTR  normal. Strength 5/5 in all extremities except for right lower extremity which cannot be adequately tested at this time. Psychiatric:  Alert oriented x1, but somewhat confused surrounding recent events.   Labs on Admission: I have personally reviewed following labs and imaging studies  CBC:  Recent Labs Lab 06/22/16 1620 06/22/16 1823  WBC 22.4*  --   NEUTROABS 18.1*  --   HGB 10.4* 11.9*  HCT 34.0* 35.0*  MCV 90.2  --   PLT 358  --    Basic Metabolic Panel:  Recent Labs Lab 06/22/16 1620 06/22/16 1823  NA 135 136  K >7.5* 6.7*  CL 111 110  CO2 17*  --   GLUCOSE 108* 117*  BUN 55* 57*  CREATININE 1.59* 1.50*  CALCIUM 8.4*  --    GFR: Estimated Creatinine Clearance: 23.6 mL/min (by C-G formula based on SCr of 1.5 mg/dL (H)). Liver Function Tests:  Recent Labs Lab 06/22/16 1620  AST 24  ALT 16  ALKPHOS 122  BILITOT 0.6  PROT 6.7  ALBUMIN 2.4*   No results for input(s): LIPASE, AMYLASE in the last 168 hours. No results  for input(s): AMMONIA in the last 168 hours. Coagulation Profile: No results for input(s): INR, PROTIME in the last 168 hours. Cardiac Enzymes: No results for input(s): CKTOTAL, CKMB, CKMBINDEX, TROPONINI in the last 168 hours. BNP (last 3 results) No results for input(s): PROBNP in the last 8760 hours. HbA1C: No results for input(s): HGBA1C in the last 72 hours. CBG: No results for input(s): GLUCAP in the last 168 hours. Lipid Profile: No results for input(s): CHOL, HDL, LDLCALC, TRIG, CHOLHDL, LDLDIRECT in the last 72 hours. Thyroid Function Tests: No results for input(s): TSH, T4TOTAL, FREET4, T3FREE, THYROIDAB in the last 72 hours. Anemia Panel: No results for input(s): VITAMINB12, FOLATE, FERRITIN, TIBC, IRON, RETICCTPCT in the last 72 hours. Urine analysis:    Component Value Date/Time   COLORURINE AMBER (A) 06/22/2016 1758   APPEARANCEUR CLOUDY (A) 06/22/2016 1758   LABSPEC 1.019 06/22/2016 1758   PHURINE 5.0 06/22/2016 1758   GLUCOSEU NEGATIVE 06/22/2016 1758   HGBUR NEGATIVE 06/22/2016 1758   BILIRUBINUR NEGATIVE 06/22/2016 1758   KETONESUR 5 (A) 06/22/2016 1758   PROTEINUR 100 (A) 06/22/2016 1758   NITRITE NEGATIVE 06/22/2016 1758   LEUKOCYTESUR MODERATE (A) 06/22/2016 1758   Sepsis Labs: No results found for this or any previous visit (from the past 240 hour(s)).   Radiological Exams on Admission: Dg Chest 2 View  Result Date: 06/22/2016 CLINICAL DATA:  History of congestive heart failure and hypertension. Generalized right lower leg pain and heel pain. EXAM: CHEST  2 VIEW COMPARISON:  None. FINDINGS: The aorta is tortuous. The heart size is enlarged. There is a 6 mm nodule in the right upper lobe. There is no focal pneumonia, pulmonary edema, or pleural effusion. There is kyphosis of spine. Degenerative joint changes of bilateral shoulders and the spine are identified. IMPRESSION: Cardiomegaly.  No focal abnormality identified within the lungs. 6 mm nodule in the  right upper lobe. Further evaluation with a chest CT on outpatient basis is recommended. Electronically Signed   By: Abelardo Diesel M.D.   On: 06/22/2016 17:18   Dg Tibia/fibula Right  Result Date: 06/22/2016 CLINICAL DATA:  Right leg and heel pain. EXAM: RIGHT TIBIA AND FIBULA - 2 VIEW COMPARISON:  None. FINDINGS: There is extensive comminuted displaced fracture of the distal femur. There is displaced fracture of the proximal fibula. There is probable fracture/ knee replacement  loosening of the proximal anterior aspect of the proximal tibia. There is no bony destruction to suggest osteomyelitis. There is anterior ulceration of the lower right anterior leg. IMPRESSION: Fractures of the distal femur, proximal fibula and probable/ knee replacement loosening of the proximal anterior aspect of the proximal tibia. Anterior ulceration of the right lower leg. No evidence of osteomyelitis. Electronically Signed   By: Abelardo Diesel M.D.   On: 06/22/2016 17:25   Dg Foot Complete Right  Result Date: 06/22/2016 CLINICAL DATA:  Ulcers on the right leg and right heel. EXAM: RIGHT FOOT COMPLETE - 3+ VIEW COMPARISON:  None. FINDINGS: There is curvilinear lucency at the base of the third and fourth metatarsals suspicious for fracture. There is cortical discontinuity in the distal aspect of the first metatarsal suspicious for fracture. There is no bony destruction to suggest osteomyelitis. There is diffuse osteopenia. Degenerative joint changes throughout the right foot are noted. IMPRESSION: No evidence of osteomyelitis. Lucency at the base of the third and fourth metatarsals and in the distal aspect of the first metatarsal suspicious for fracture. Electronically Signed   By: Abelardo Diesel M.D.   On: 06/22/2016 17:21    EKG: Independently reviewed. Sinus rhythm with prolonged PR interval and signs of T-wave peaking  Assessment/Plan Hyperkalemia: Acute. Patient's initial potassium was noted be >7.5. Patient was given IV  fluids and hyperkalemia protocol was initiated as EKG showed T wave changes. - Admit to stepdown - Hold home oral potassium - Recheck potassium   - Continue to treat as needed   Urinary tract infection: Acute. Urinalysis was positive for many bacteria - Follow-up urine culture - continue Abx of Aztreonam   Acute encephalopathy: Patient probably had to be given Narcan to wake after being given 100 mg of tramadol from nursing facility. Question if this is multifactorial given her urinary tract infection as well. - neuro checks - Hold opioid pain medications  - May want to restart at the Tramadol at 25 mg dose prn in a.m.  Acute kidney injury: Baseline creatinine unknown at this time. However, on admission creatinine 1.59 with a BUN of 55. - Hold Lasix and lisinopril - recheck bmp in am  Leukocytosis: Wbc elevated at 22.4 but normal lactic acid level on admission. - Recheck CBC in a.m.  Right leg ulcerations/pressure ulceration - Wound care consult  CHF history: Patient reportedly had cardiac evaluation prior to the surgical procedure at [facility] his family thinks her ejection fraction was 78%. - Strict intake and output  - Obtain records from [facility] requested  DVT: Patient reportedly as DVT in the right lower extremity but was never placed on any anticoagulation. - f/u medical records - Question reasoning for patient not being placed on anticoagulation.  Right leg fracture:Fracture occurred on Thanksgiving. Patient was evaluated by orthopedics, blood pressure was not able to be surgically corrected. Family requests second opinion.  - PT evaluate and treatment   Essential hypertension: Patient initially found to have borderline blood pressures on admission. - Hold the clonidine patch, lisinopril, and Lasix - Monitor blood pressure and restart medications as medically appropriate  Anemia: Hemoglobin 10.4. Patient with normal MCV and MCH. -  Recheck CBC in a.m.  GERD - Pharmacy substitution of Protonix for omeprazole  DVT prophylaxis: lovenox   Code Status: Full Family Communication:  discussed plan of care with the patient and family present at bedside  Disposition Plan: will likely discharge home to collapse Consults called: None Admission  status: inpatient  Norval Morton MD [facility] Pager [phone]  If 7PM-7AM, please contact night-coverage [email or web address] Password [email or web address]  06/22/2016, 7:07 PM

## 2016-06-23 DIAGNOSIS — I82402 Acute embolism and thrombosis of unspecified deep veins of left lower extremity: Secondary | ICD-10-CM

## 2016-06-23 DIAGNOSIS — L8990 Pressure ulcer of unspecified site, unspecified stage: Secondary | ICD-10-CM | POA: Diagnosis present

## 2016-06-23 DIAGNOSIS — N179 Acute kidney failure, unspecified: Secondary | ICD-10-CM | POA: Diagnosis present

## 2016-06-23 DIAGNOSIS — D649 Anemia, unspecified: Secondary | ICD-10-CM | POA: Diagnosis present

## 2016-06-23 DIAGNOSIS — I10 Essential (primary) hypertension: Secondary | ICD-10-CM

## 2016-06-23 DIAGNOSIS — I70233 Atherosclerosis of native arteries of right leg with ulceration of ankle: Secondary | ICD-10-CM

## 2016-06-23 DIAGNOSIS — N3000 Acute cystitis without hematuria: Secondary | ICD-10-CM | POA: Diagnosis present

## 2016-06-23 DIAGNOSIS — S7291XA Unspecified fracture of right femur, initial encounter for closed fracture: Secondary | ICD-10-CM | POA: Diagnosis present

## 2016-06-23 DIAGNOSIS — D72829 Elevated white blood cell count, unspecified: Secondary | ICD-10-CM | POA: Diagnosis present

## 2016-06-23 DIAGNOSIS — G9340 Encephalopathy, unspecified: Secondary | ICD-10-CM | POA: Diagnosis present

## 2016-06-23 DIAGNOSIS — Z8679 Personal history of other diseases of the circulatory system: Secondary | ICD-10-CM

## 2016-06-23 DIAGNOSIS — S72091A Other fracture of head and neck of right femur, initial encounter for closed fracture: Secondary | ICD-10-CM

## 2016-06-23 LAB — BASIC METABOLIC PANEL
ANION GAP: 8 (ref 5–15)
Anion gap: 4 — ABNORMAL LOW (ref 5–15)
BUN: 52 mg/dL — ABNORMAL HIGH (ref 6–20)
BUN: 44 mg/dL — ABNORMAL HIGH (ref 6–20)
CHLORIDE: 111 mmol/L (ref 101–111)
CO2: 16 mmol/L — ABNORMAL LOW (ref 22–32)
CO2: 15 mmol/L — AB (ref 22–32)
Calcium: 8 mg/dL — ABNORMAL LOW (ref 8.9–10.3)
Calcium: 7.9 mg/dL — ABNORMAL LOW (ref 8.9–10.3)
Chloride: 114 mmol/L — ABNORMAL HIGH (ref 101–111)
Creatinine, Ser: 1.36 mg/dL — ABNORMAL HIGH (ref 0.44–1.00)
Creatinine, Ser: 1 mg/dL (ref 0.44–1.00)
GFR calc Af Amer: 37 mL/min — ABNORMAL LOW (ref >60)
GFR calc Af Amer: 53 mL/min — ABNORMAL LOW (ref >60)
GFR calc non Af Amer: 32 mL/min — ABNORMAL LOW (ref >60)
GFR calc non Af Amer: 46 mL/min — ABNORMAL LOW (ref >60)
GLUCOSE: 156 mg/dL — AB (ref 65–99)
Glucose, Bld: 117 mg/dL — ABNORMAL HIGH (ref 65–99)
POTASSIUM: 5.5 mmol/L — AB (ref 3.5–5.1)
Potassium: 5.9 mmol/L — ABNORMAL HIGH (ref 3.5–5.1)
Sodium: 134 mmol/L — ABNORMAL LOW (ref 135–145)
Sodium: 134 mmol/L — ABNORMAL LOW (ref 135–145)

## 2016-06-23 LAB — CBC
HCT: 30.5 % — ABNORMAL LOW (ref 36.0–46.0)
HEMATOCRIT: 29.6 % — AB (ref 36.0–46.0)
HEMOGLOBIN: 9.3 g/dL — AB (ref 12.0–15.0)
Hemoglobin: 9.6 g/dL — ABNORMAL LOW (ref 12.0–15.0)
MCH: 27.7 pg (ref 26.0–34.0)
MCH: 28.1 pg (ref 26.0–34.0)
MCHC: 31.4 g/dL (ref 30.0–36.0)
MCHC: 31.5 g/dL (ref 30.0–36.0)
MCV: 88.2 fL (ref 78.0–100.0)
MCV: 89.4 fL (ref 78.0–100.0)
Platelets: 310 10*3/uL (ref 150–400)
Platelets: 338 10*3/uL (ref 150–400)
RBC: 3.31 MIL/uL — ABNORMAL LOW (ref 3.87–5.11)
RBC: 3.46 MIL/uL — ABNORMAL LOW (ref 3.87–5.11)
RDW: 14.7 % (ref 11.5–15.5)
RDW: 14.7 % (ref 11.5–15.5)
WBC: 17.1 10*3/uL — ABNORMAL HIGH (ref 4.0–10.5)
WBC: 19.7 10*3/uL — AB (ref 4.0–10.5)

## 2016-06-23 LAB — BLOOD CULTURE ID PANEL (REFLEXED)
ACINETOBACTER BAUMANNII: NOT DETECTED
CANDIDA ALBICANS: NOT DETECTED
Candida glabrata: NOT DETECTED
Candida krusei: NOT DETECTED
Candida parapsilosis: NOT DETECTED
Candida tropicalis: NOT DETECTED
ENTEROCOCCUS SPECIES: NOT DETECTED
Enterobacter cloacae complex: NOT DETECTED
Enterobacteriaceae species: NOT DETECTED
Escherichia coli: NOT DETECTED
HAEMOPHILUS INFLUENZAE: NOT DETECTED
Klebsiella oxytoca: NOT DETECTED
Klebsiella pneumoniae: NOT DETECTED
LISTERIA MONOCYTOGENES: NOT DETECTED
NEISSERIA MENINGITIDIS: NOT DETECTED
PSEUDOMONAS AERUGINOSA: NOT DETECTED
Proteus species: NOT DETECTED
STREPTOCOCCUS AGALACTIAE: NOT DETECTED
STREPTOCOCCUS PYOGENES: NOT DETECTED
STREPTOCOCCUS SPECIES: NOT DETECTED
Serratia marcescens: NOT DETECTED
Staphylococcus aureus (BCID): NOT DETECTED
Staphylococcus species: NOT DETECTED
Streptococcus pneumoniae: NOT DETECTED

## 2016-06-23 LAB — MRSA PCR SCREENING: MRSA by PCR: NEGATIVE

## 2016-06-23 LAB — POTASSIUM: Potassium: 5.6 mmol/L — ABNORMAL HIGH (ref 3.5–5.1)

## 2016-06-23 MED ORDER — SODIUM CHLORIDE 0.9 % IV BOLUS (SEPSIS)
500.0000 mL | Freq: Once | INTRAVENOUS | Status: AC
  Administered 2016-06-23: 500 mL via INTRAVENOUS

## 2016-06-23 MED ORDER — ENOXAPARIN SODIUM 40 MG/0.4ML ~~LOC~~ SOLN
40.0000 mg | SUBCUTANEOUS | Status: DC
  Administered 2016-06-23 – 2016-06-26 (×4): 40 mg via SUBCUTANEOUS
  Filled 2016-06-23 (×4): qty 0.4

## 2016-06-23 MED ORDER — SODIUM POLYSTYRENE SULFONATE 15 GM/60ML PO SUSP
30.0000 g | Freq: Once | ORAL | Status: AC
  Administered 2016-06-23: 30 g via ORAL
  Filled 2016-06-23: qty 120

## 2016-06-23 MED ORDER — ENOXAPARIN SODIUM 30 MG/0.3ML ~~LOC~~ SOLN: 30.0000 mg | SUBCUTANEOUS | Status: DC

## 2016-06-23 MED ORDER — COLLAGENASE 250 UNIT/GM EX OINT
TOPICAL_OINTMENT | Freq: Every day | CUTANEOUS | Status: DC
  Administered 2016-06-23 – 2016-06-24 (×2): via TOPICAL
  Administered 2016-06-25 – 2016-06-27 (×3): 1 via TOPICAL
  Filled 2016-06-23: qty 30

## 2016-06-23 NOTE — Clinical Social Work Note (Signed)
Clinical Social Work Assessment  Patient Details  Name: Cindy Mills MRN: [id number] Date of Birth: 08/19/1917  Date of referral:  06/23/16               Reason for consult:  Facility Placement                Permission sought to share information with:  Chartered certified accountant granted to share information::  Yes, Verbal Permission Granted  Name::     Metallurgist::  [facility]  Relationship::  son  Contact Information:     Housing/Transportation Living arrangements for the past 2 months:  [place] of Information:  Adult Children Patient Interpreter Needed:  None Criminal Activity/Legal Involvement Pertinent to Current Situation/Hospitalization:  No - Comment as needed Significant Relationships:  Adult Children Lives with:  Facility Resident Do you feel safe going back to the place where you live?    Need for family participation in patient care:  Yes (Comment) (decision making)  Care giving concerns:  Pt  Was at SNF [facility] where family did not think patient was properly cared for- had a fall recently.   Social Worker assessment / plan:  CSW spoke with pt son concerning plan for pt at time of DC.  Patient son reports that pt had just arrived at [facility] when she had to be brought back to the hospital.    Employment status:  Retired Forensic scientist:  Medicare PT Recommendations:  Not assessed at this time Information / Referral to community resources:  [place]  Patient/Family's Response to care:  Agreeable to plan for return to [facility] at time of DC.  Patient/Family's Understanding of and Emotional Response to Diagnosis, Current Treatment, and Prognosis:  Pt son appreciative of CSW contact and help with pt return to facility.  Hopeful pt will recover fully.  Emotional Assessment Appearance:  Appears stated age Attitude/Demeanor/Rapport:  Unable to Assess Affect (typically observed):  Unable to  Assess Orientation:  Oriented to Self Alcohol / Substance use:  Not Applicable Psych involvement (Current and /or in the community):  No (Comment)  Discharge Needs  Concerns to be addressed:  Care Coordination Readmission within the last 30 days:  No Current discharge risk:  None Barriers to Discharge:  Continued Medical Work up   Jorge Ny, LCSW 06/23/2016, 3:37 PM

## 2016-06-23 NOTE — Progress Notes (Signed)
PROGRESS NOTE    Cindy Mills  [id number] DOB: 1918-06-01 DOA: 06/22/2016 PCP: No primary care provider on file.   Brief Narrative:  83 y.o. WF PMHx  HTN, CHF, Breast cancer, Rt Leg DVT not on anticoagulant;   Who presented with altered mental status. Family who is present at bedside provide history as patient is currently confused. Patient was previously being cared for at [facility] assisted living facility. Patient had fallen at Thanksgiving and fractured her right leg. During the hospitalization at [facility] she was also found to have DVT for which it appears that the patient was not placed on any anticoagulation. She was evaluated by orthopedic surgery, but was unable to have surgical correction due to issues with the right knee. Following the hospitalization she was transferred back to [facility] to the nursing home part, and was placed on pain medications tramadol and fentanyl. Family notes that the facility continue to increase dosage of these medications and as a result the patient had declining function, mental status, and issues with dehydration. They requested multiple times for pain medications to be decreased. Family after evaluating overall care decided to transfer her from [facility] to [facility]. Family notes that there was found to fentanyl patch discontinued but she had been increased from tramadol 25 mg 100 mg for pain. However, after being transferred to [place] home today patient was found to be semi-conscious and difficult to wake.  EMS was called prior to her being checked in to the nursing home. En route they had to administered Narcan with some improvement of patient mental status. Son who is also patient's POA  changed the DO NOT RESUSCITATE status and wants her to be a full code.   Subjective: 1/2  A/O 4, patient able to relate exactly what happened to her right leg since 1947. Patient was mobile with walker up until  Thanksgiving when she fractured her right knee/thigh patient and family request second opinion as to ability to replace/stabilize knee/thigh     Assessment & Plan:   Principal Problem:   Hyperkalemia Active Problems:   UTI (urinary tract infection)   Pressure injury of skin   AKI (acute kidney injury) ([place])   Acute encephalopathy   Leukocytosis   History of CHF (congestive heart failure)   Anemia   Hyperkalemia:  -Acute. Patient's initial potassium was noted be >7.5. Patient was given IV fluids and hyperkalemia protocol was initiated as EKG showed T wave changes. -Kayexalate 30 g 1 -Recheck potassium 2300  Urinary tract infection:  - continue empiric Aztreonam   Acute encephalopathy:  -Resolved - Hold opioid pain medications  - May want to restart at the Tramadol at 25 mg dose prn in a.m.  Acute renal failure (Baseline Cr 1.59) Lab Results  Component Value Date   CREATININE 1.00 06/23/2016   CREATININE 1.36 (H) 06/23/2016   CREATININE 1.50 (H) 06/22/2016  - Hold Lasix and lisinopril -At baseline  Leukocytosis:  Recent Labs Lab 06/22/16 1620 06/23/16 0027 06/23/16 0929  WBC 22.4* 19.7* 17.1*  -Trending down   Right leg ulcerations/pressure ulceration - Wound care consult  Right knee/femur fracture  -Obtain [facility] from [facility]  -On 1/3 consult orthopedic surgery for second opinion. Repair/stabilization of knee/right femur  CHF - cardiac evaluation prior to the surgical procedure [facility] his family thinks her EF = 78%. - Strict intake and output  -Daily weight - Obtain records from  [facility] requested  DVT:  -DVT in the right lower extremity but was never placed on any anticoagulation. - Question reasoning for patient not being placed on anticoagulation. Secondary to surgery?  Essential hypertension:  -Patient initially found to have borderline blood pressures on admission. - Hold  the clonidine patch, lisinopril, and Lasix - Monitor blood pressure and restart medications as medically appropriate  Anemia: Hemoglobin 10.4. Patient with normal MCV and MCH. - Recheck CBC in a.m.      DVT prophylaxis: Lovenox Code Status: Full Family Communication: Son and daughter present Disposition Plan: Evaluation of right leg for surgery/stabilization   Consultants:  NA  Procedures/Significant Events:  1/1 DG tibia/fibula right:-Fractures of the distal femur, proximal fibula and probable/ knee replacement loosening of the proximal anterior aspect of the proximal tibia. -Anterior ulceration of the right lower leg. 1/1 DG right foot:-Lucency at the base of the third and fourth metatarsals and in the distal aspect of the first metatarsal suspicious for fracture.   VENTILATOR SETTINGS: NA    Cultures 1/1 blood positive G VR 1/2 1/1 right leg wound positive few GPC in pairs 1/2 urine pending   Antimicrobials: Anti-infectives    Start     Stop   06/22/16 1845  aztreonam (AZACTAM) 1 g in dextrose 5 % 50 mL IVPB  Status:  Discontinued     06/22/16 1844   06/22/16 1845  aztreonam (AZACTAM) 500 mg in dextrose 5 % 50 mL IVPB             Devices Right leg soft brace   LINES / TUBES:  NA    Continuous Infusions: . sodium chloride 75 mL/hr at 06/23/16 1706     Objective: Vitals:   06/23/16 0943 06/23/16 1242 06/23/16 1300 06/23/16 1604  BP:  (!) 137/59 (!) 146/56 139/63  Pulse:   69   Resp:   18   Temp:  98.1 F (36.7 C)  98.5 F (36.9 C)  TempSrc:  Oral  Oral  SpO2: 100%  100%   Weight:      Height:        Intake/Output Summary (Last 24 hours) at 06/23/16 1816 Last data filed at 06/23/16 1200  Gross per 24 hour  Intake          1747.92 ml  Output                2 ml  Net          1745.92 ml   Filed Weights   06/22/16 1551 06/22/16 2058  Weight: 81.6 kg (180 lb) 82 kg (180 lb 12.4 oz)    Examination:  General: A/O 4, NAD, No acute  respiratory distress Eyes: negative scleral hemorrhage, negative anisocoria, negative icterus ENT: Negative Runny nose, negative gingival bleeding, Neck:  Negative scars, masses, torticollis, lymphadenopathy, JVD Lungs: Clear to auscultation bilaterally without wheezes or crackles Cardiovascular: Regular rate and rhythm without murmur gallop or rub normal S1 and S2 Abdomen: negative abdominal pain, nondistended, positive soft, bowel sounds, no rebound, no ascites, no appreciable mass Extremities: RLE in case a soft immobilizer, +1+ pitting edema right foot, plus clubbing right toes Skin: Positive ulceration lateral aspect right ankle  Psychiatric:  Negative depression, negative anxiety, negative fatigue, negative mania  Central nervous system:  Cranial nerves II through XII intact, tongue/uvula midline, all extremities muscle strength 5/5, sensation intact throughout, negative dysarthria, negative expressive aphasia, negative receptive aphasia.  .     Data Reviewed: Care  during the described time interval was provided by me .  I have reviewed this patient's available data, including medical history, events of note, physical examination, and all test results as part of my evaluation. I have personally reviewed and interpreted all radiology studies.  CBC:  Recent Labs Lab 06/22/16 1620 06/22/16 1823 06/23/16 0027 06/23/16 0929  WBC 22.4*  --  19.7* 17.1*  NEUTROABS 18.1*  --   --   --   HGB 10.4* 11.9* 9.3* 9.6*  HCT 34.0* 35.0* 29.6* 30.5*  MCV 90.2  --  89.4 88.2  PLT 358  --  338 [id number]   Basic Metabolic Panel:  Recent Labs Lab 06/22/16 1620 06/22/16 1823 06/22/16 2109 06/23/16 0027 06/23/16 0929  NA 135 136  --  134* 134*  K >7.5* 6.7* 5.5* 5.5*  5.6* 5.9*  CL 111 110  --  111 114*  CO2 17*  --   --  15* 16*  GLUCOSE 108* 117*  --  156* 117*  BUN 55* 57*  --  52* 44*  CREATININE 1.59* 1.50*  --  1.36* 1.00  CALCIUM 8.4*  --   --  8.0* 7.9*   GFR: Estimated  Creatinine Clearance: 35.5 mL/min (by C-G formula based on SCr of 1 mg/dL). Liver Function Tests:  Recent Labs Lab 06/22/16 1620  AST 24  ALT 16  ALKPHOS 122  BILITOT 0.6  PROT 6.7  ALBUMIN 2.4*   No results for input(s): LIPASE, AMYLASE in the last 168 hours. No results for input(s): AMMONIA in the last 168 hours. Coagulation Profile:  Recent Labs Lab 06/22/16 2156  INR 1.22   Cardiac Enzymes: No results for input(s): CKTOTAL, CKMB, CKMBINDEX, TROPONINI in the last 168 hours. BNP (last 3 results) No results for input(s): PROBNP in the last 8760 hours. HbA1C: No results for input(s): HGBA1C in the last 72 hours. CBG:  Recent Labs Lab 06/22/16 2150  GLUCAP 154*   Lipid Profile: No results for input(s): CHOL, HDL, LDLCALC, TRIG, CHOLHDL, LDLDIRECT in the last 72 hours. Thyroid Function Tests: No results for input(s): TSH, T4TOTAL, FREET4, T3FREE, THYROIDAB in the last 72 hours. Anemia Panel: No results for input(s): VITAMINB12, FOLATE, FERRITIN, TIBC, IRON, RETICCTPCT in the last 72 hours. Urine analysis:    Component Value Date/Time   COLORURINE AMBER (A) 06/22/2016 1758   APPEARANCEUR CLOUDY (A) 06/22/2016 1758   LABSPEC 1.019 06/22/2016 1758   PHURINE 5.0 06/22/2016 1758   GLUCOSEU NEGATIVE 06/22/2016 1758   HGBUR NEGATIVE 06/22/2016 1758   BILIRUBINUR NEGATIVE 06/22/2016 1758   KETONESUR 5 (A) 06/22/2016 1758   PROTEINUR 100 (A) 06/22/2016 1758   NITRITE NEGATIVE 06/22/2016 1758   LEUKOCYTESUR MODERATE (A) 06/22/2016 1758   Sepsis Labs: @LABRCNTIP (procalcitonin:4,lacticidven:4)  ) Recent Results (from the past 240 hour(s))  Culture, blood (routine x 2)     Status: None (Preliminary result)   Collection Time: 06/22/16  5:45 PM  Result Value Ref Range Status   Specimen Description BLOOD LEFT ANTECUBITAL  Final   Special Requests BOTTLES DRAWN AEROBIC AND ANAEROBIC 5CC  Final   Culture  Setup Time   Final    GRAM VARIABLE ROD ANAEROBIC BOTTLE  ONLY CRITICAL RESULT CALLED TO, READ BACK BY AND VERIFIED WITH: [facility] M.MACCIA [id number] MLM    Culture GRAM VARIABLE ROD  Final   Report Status PENDING  Incomplete  Blood Culture ID Panel (Reflexed)     Status: None   Collection Time: 06/22/16  5:45 PM  Result Value  Ref Range Status   Enterococcus species NOT DETECTED NOT DETECTED Final   Listeria monocytogenes NOT DETECTED NOT DETECTED Final   Staphylococcus species NOT DETECTED NOT DETECTED Final   Staphylococcus aureus NOT DETECTED NOT DETECTED Final   Streptococcus species NOT DETECTED NOT DETECTED Final   Streptococcus agalactiae NOT DETECTED NOT DETECTED Final   Streptococcus pneumoniae NOT DETECTED NOT DETECTED Final   Streptococcus pyogenes NOT DETECTED NOT DETECTED Final   Acinetobacter baumannii NOT DETECTED NOT DETECTED Final   Enterobacteriaceae species NOT DETECTED NOT DETECTED Final   Enterobacter cloacae complex NOT DETECTED NOT DETECTED Final   Escherichia coli NOT DETECTED NOT DETECTED Final   Klebsiella oxytoca NOT DETECTED NOT DETECTED Final   Klebsiella pneumoniae NOT DETECTED NOT DETECTED Final   Proteus species NOT DETECTED NOT DETECTED Final   Serratia marcescens NOT DETECTED NOT DETECTED Final   Haemophilus influenzae NOT DETECTED NOT DETECTED Final   Neisseria meningitidis NOT DETECTED NOT DETECTED Final   Pseudomonas aeruginosa NOT DETECTED NOT DETECTED Final   Candida albicans NOT DETECTED NOT DETECTED Final   Candida glabrata NOT DETECTED NOT DETECTED Final   Candida krusei NOT DETECTED NOT DETECTED Final   Candida parapsilosis NOT DETECTED NOT DETECTED Final   Candida tropicalis NOT DETECTED NOT DETECTED Final  Wound or Superficial Culture     Status: None (Preliminary result)   Collection Time: 06/22/16  6:49 PM  Result Value Ref Range Status   Specimen Description LEG RIGHT  Final   Special Requests NONE  Final   Gram Stain   Final    FEW WBC PRESENT, PREDOMINANTLY PMN FEW GRAM POSITIVE COCCI  IN PAIRS    Culture PENDING  Incomplete   Report Status PENDING  Incomplete  MRSA PCR Screening     Status: None   Collection Time: 06/22/16  8:52 PM  Result Value Ref Range Status   MRSA by PCR NEGATIVE NEGATIVE Final    Comment:        The GeneXpert MRSA Assay (FDA approved for NASAL specimens only), is one component of a comprehensive MRSA colonization surveillance program. It is not intended to diagnose MRSA infection nor to guide or monitor treatment for MRSA infections.   Culture, blood (routine x 2)     Status: None (Preliminary result)   Collection Time: 06/22/16  9:13 PM  Result Value Ref Range Status   Specimen Description BLOOD RIGHT ANTECUBITAL  Final   Special Requests IN PEDIATRIC BOTTLE 1.5CC  Final   Culture NO GROWTH < 24 HOURS  Final   Report Status PENDING  Incomplete         Radiology Studies: Dg Chest 2 View  Result Date: 06/22/2016 CLINICAL DATA:  History of congestive heart failure and hypertension. Generalized right lower leg pain and heel pain. EXAM: CHEST  2 VIEW COMPARISON:  None. FINDINGS: The aorta is tortuous. The heart size is enlarged. There is a 6 mm nodule in the right upper lobe. There is no focal pneumonia, pulmonary edema, or pleural effusion. There is kyphosis of spine. Degenerative joint changes of bilateral shoulders and the spine are identified. IMPRESSION: Cardiomegaly.  No focal abnormality identified within the lungs. 6 mm nodule in the right upper lobe. Further evaluation with a chest CT on outpatient basis is recommended. Electronically Signed   By: Abelardo Diesel M.D.   On: 06/22/2016 17:18   Dg Tibia/fibula Right  Result Date: 06/22/2016 CLINICAL DATA:  Right leg and heel pain. EXAM: RIGHT TIBIA AND FIBULA -  2 VIEW COMPARISON:  None. FINDINGS: There is extensive comminuted displaced fracture of the distal femur. There is displaced fracture of the proximal fibula. There is probable fracture/ knee replacement loosening of the proximal  anterior aspect of the proximal tibia. There is no bony destruction to suggest osteomyelitis. There is anterior ulceration of the lower right anterior leg. IMPRESSION: Fractures of the distal femur, proximal fibula and probable/ knee replacement loosening of the proximal anterior aspect of the proximal tibia. Anterior ulceration of the right lower leg. No evidence of osteomyelitis. Electronically Signed   By: Abelardo Diesel M.D.   On: 06/22/2016 17:25   Dg Foot Complete Right  Result Date: 06/22/2016 CLINICAL DATA:  Ulcers on the right leg and right heel. EXAM: RIGHT FOOT COMPLETE - 3+ VIEW COMPARISON:  None. FINDINGS: There is curvilinear lucency at the base of the third and fourth metatarsals suspicious for fracture. There is cortical discontinuity in the distal aspect of the first metatarsal suspicious for fracture. There is no bony destruction to suggest osteomyelitis. There is diffuse osteopenia. Degenerative joint changes throughout the right foot are noted. IMPRESSION: No evidence of osteomyelitis. Lucency at the base of the third and fourth metatarsals and in the distal aspect of the first metatarsal suspicious for fracture. Electronically Signed   By: Abelardo Diesel M.D.   On: 06/22/2016 17:21        Scheduled Meds: . aspirin  325 mg Oral Daily  . aztreonam  500 mg Intravenous Q8H  . budesonide  0.25 mg Nebulization BID  . collagenase   Topical Daily  . enoxaparin (LOVENOX) injection  40 mg Subcutaneous Q24H  . feeding supplement (PRO-STAT SUGAR FREE 64)  30 mL Oral TID WC  . oxybutynin  5 mg Oral Daily  . pantoprazole  40 mg Oral Daily  . sodium chloride flush  3 mL Intravenous Q12H   Continuous Infusions: . sodium chloride 75 mL/hr at 06/23/16 1706     LOS: 1 day    Time spent: 40 minutes    WOODS, Geraldo Docker, MD [facility] Pager [phone]   If 7PM-7AM, please contact night-coverage [email or web address] Password [facility] 06/23/2016, 6:16 PM

## 2016-06-23 NOTE — Progress Notes (Signed)
PHARMACY - PHYSICIAN COMMUNICATION CRITICAL VALUE ALERT - BLOOD CULTURE IDENTIFICATION (BCID)  Results for orders placed or performed during the hospital encounter of 06/22/16  Blood Culture ID Panel (Reflexed) (Collected: 06/22/2016  5:45 PM)  Result Value Ref Range   Enterococcus species NOT DETECTED NOT DETECTED   Listeria monocytogenes NOT DETECTED NOT DETECTED   Staphylococcus species NOT DETECTED NOT DETECTED   Staphylococcus aureus NOT DETECTED NOT DETECTED   Streptococcus species NOT DETECTED NOT DETECTED   Streptococcus agalactiae NOT DETECTED NOT DETECTED   Streptococcus pneumoniae NOT DETECTED NOT DETECTED   Streptococcus pyogenes NOT DETECTED NOT DETECTED   Acinetobacter baumannii NOT DETECTED NOT DETECTED   Enterobacteriaceae species NOT DETECTED NOT DETECTED   Enterobacter cloacae complex NOT DETECTED NOT DETECTED   Escherichia coli NOT DETECTED NOT DETECTED   Klebsiella oxytoca NOT DETECTED NOT DETECTED   Klebsiella pneumoniae NOT DETECTED NOT DETECTED   Proteus species NOT DETECTED NOT DETECTED   Serratia marcescens NOT DETECTED NOT DETECTED   Haemophilus influenzae NOT DETECTED NOT DETECTED   Neisseria meningitidis NOT DETECTED NOT DETECTED   Pseudomonas aeruginosa NOT DETECTED NOT DETECTED   Candida albicans NOT DETECTED NOT DETECTED   Candida glabrata NOT DETECTED NOT DETECTED   Candida krusei NOT DETECTED NOT DETECTED   Candida parapsilosis NOT DETECTED NOT DETECTED   Candida tropicalis NOT DETECTED NOT DETECTED    Name of physician (or Provider) Contacted: Dr Sherral Hammers   Changes to prescribed antibiotics required: None  Wynell Balloon 06/23/2016  12:23 PM

## 2016-06-23 NOTE — Plan of Care (Signed)
Problem: Education: Goal: Knowledge of  General Education information/materials will improve Outcome: Progressing Discussed with daughter about plan of care for the evening; Discussed with patient upon awakening about where she was an oriented her to place and situation with some teach back displayed.

## 2016-06-23 NOTE — Consult Note (Signed)
[place] Nurse wound consult note Reason for Consult:Complex history including fall with fracture to right leg.  Immobilizer in place.  Altered mental status.  Patient's resting state is an outward rotation of right leg. Unstageable pressure injury to right lateral heel 100% eschar Unstageable pressure injury to Right lower leg near medial malleolus Device related pressure injury to right posterior leg from immobilizer Wound type:pressure injury Pressure Ulcer POA: Yes Measurement:Right lateral heel 2 cm x 3 cm 100% eschar Right posterior lower leg 5 cm x 2.4 cm 100% slough Right lower leg near medial malleolus with 2 -0.3 cm nonintact lesions that are consistent with medical adhesive related skin injury:  4 cm x 1.5 cmx 0.3 cm  100% slough Wound bed:100% devitalized tissue, right heel is dry, intact stable eschar Drainage (amount, consistency, odor) All others (not heel) have moderate serosanguinous drainage and musty odor.  Periwound:Erythema.  Knee immobilizer in place.   Dressing procedure/placement/frequency:Cleanse right lateral heel wound with soap and water and pat gently dry.  Dry dressing only.  DO not debride this dry stable eschar.  Re-consult if drainage develops.   Cleanse wounds to right posterior leg and right medial and lateral leg with NS.  APply Santyl ointment for debridement. Cover with NS moist gauze.  Secure with 4x4 gauze and kerlix/tape.  Change daily.  Will not follow at this time.  Please re-consult if needed.  Domenic Moras RN BSN [place] Pager [phone]

## 2016-06-23 NOTE — NC FL2 (Signed)
[place] LEVEL OF CARE SCREENING TOOL     IDENTIFICATION  Patient Name: Cindy Mills Birthdate: 26-Jun-1917 Sex: female Admission Date (Current Location): 06/22/2016  [facility] and [place] Number:  Herbalist and Address:  The . [facility], [place] [address] [place], [place] [address]      Provider Number: [id number]  Attending Physician Name and Address:  Allie Bossier, MD  Relative Name and Phone Number:  Elenore Rota, son, [phone]    Current Level of Care: Hospital Recommended Level of Care: [place] Prior Approval Number:    Date Approved/Denied:   PASRR Number: [id number]  Discharge Plan: SNF    Current Diagnoses: Patient Active Problem List   Diagnosis Date Noted  . Pressure injury of skin 06/23/2016  . AKI (acute kidney injury) ([place]) 06/23/2016  . Acute encephalopathy 06/23/2016  . Leukocytosis 06/23/2016  . History of CHF (congestive heart failure) 06/23/2016  . Anemia 06/23/2016  . Hyperkalemia 06/22/2016  . UTI (urinary tract infection) 06/22/2016    Orientation RESPIRATION BLADDER Height & Weight     Self  Normal Incontinent Weight: 82 kg (180 lb 12.4 oz) Height:  5\' 6"  (167.6 cm)  BEHAVIORAL SYMPTOMS/MOOD NEUROLOGICAL BOWEL NUTRITION STATUS      Incontinent Diet (Please see DC Summary)  AMBULATORY STATUS COMMUNICATION OF NEEDS Skin   Extensive Assist Verbally PU Stage and Appropriate Care (Unstageable on foot and leg)                       Personal Care Assistance Level of Assistance  Bathing, Feeding, Dressing Bathing Assistance: Maximum assistance Feeding assistance: Limited assistance Dressing Assistance: Limited assistance     Functional Limitations Info  Hearing, Sight Sight Info: Impaired Hearing Info: Impaired      SPECIAL CARE FACTORS FREQUENCY  PT (By licensed PT)     PT Frequency: not yet assessed              Contractures      Additional Factors Info  Code  Status, Allergies Code Status Info: Full Allergies Info: Penicillins, Amoxicillin, Sulfa Antibiotics           Current Medications (06/23/2016):  This is the current hospital active medication list Current Facility-Administered Medications  Medication Dose Route Frequency Provider Last Rate Last Dose  . 0.9 %  sodium chloride infusion   Intravenous Continuous Norval Morton, MD 75 mL/hr at 06/22/16 2121    . acetaminophen (TYLENOL) tablet 650 mg  650 mg Oral Q6H PRN Norval Morton, MD       Or  . acetaminophen (TYLENOL) suppository 650 mg  650 mg Rectal Q6H PRN Norval Morton, MD      . albuterol (PROVENTIL) (2.5 MG/3ML) 0.083% nebulizer solution 2.5 mg  2.5 mg Nebulization Q4H PRN Norval Morton, MD      . aspirin tablet 325 mg  325 mg Oral Daily Rondell A Tamala Julian, MD      . aztreonam (AZACTAM) 500 mg in dextrose 5 % 50 mL IVPB  500 mg Intravenous Q8H Davonna Belling, MD 100 mL/hr at 06/23/16 0530 500 mg at 06/23/16 0530  . budesonide (PULMICORT) nebulizer solution 0.25 mg  0.25 mg Nebulization BID Norval Morton, MD   0.25 mg at 06/23/16 0942  . collagenase (SANTYL) ointment   Topical Daily Allie Bossier, MD      . enoxaparin (LOVENOX) injection 40 mg  40 mg Subcutaneous Q24H Venia Carbon  Maccia, RPH      . feeding supplement (PRO-STAT SUGAR FREE 64) liquid 30 mL  30 mL Oral TID WC Rondell A Tamala Julian, MD      . ondansetron (ZOFRAN) tablet 4 mg  4 mg Oral Q6H PRN Norval Morton, MD       Or  . ondansetron (ZOFRAN) injection 4 mg  4 mg Intravenous Q6H PRN Norval Morton, MD      . oxybutynin (DITROPAN-XL) 24 hr tablet 5 mg  5 mg Oral Daily Rondell A Smith, MD      . pantoprazole (PROTONIX) EC tablet 40 mg  40 mg Oral Daily Rondell A Smith, MD      . sodium chloride flush (NS) 0.9 % injection 3 mL  3 mL Intravenous Q12H Norval Morton, MD         Discharge Medications: Please see discharge summary for a list of discharge medications.  Relevant Imaging Results:  Relevant Lab  Results:   Additional Information SSN: Mellen Wedder[place], [place]

## 2016-06-23 NOTE — Evaluation (Signed)
Physical Therapy Evaluation Patient Details Name: Cindy Mills MRN: [id number] DOB: 11-Jun-1918 Today's Date: 06/23/2016   History of Present Illness  83 y.o.femalewith medical history significant of HTN, CHF, breast cancer, and dvt; who presented with altered mental status from [facility] home. Patient was transfer to [facility] from [facility] home where she suffered fall with RLE fracture, non-operable.  Clinical Impression  Patient demonstrates deficits in functional mobility as indicated below. Will need continued skilled PT to address deficits and maximize function. Will see as indicated and progress as tolerated.    Follow Up Recommendations SNF;Supervision/Assistance - 24 hour    Equipment Recommendations   (TBD)    Recommendations for Other Services       Precautions / Restrictions Precautions Precautions: Fall Required Braces or Orthoses: Knee Immobilizer - Right Restrictions Weight Bearing Restrictions: Yes RLE Weight Bearing: Non weight bearing      Mobility  Bed Mobility Overal bed mobility: Needs Assistance Bed Mobility: Rolling;Sidelying to Sit Rolling: Mod assist Sidelying to sit: Total assist (attempted to sit but unable to perform due to pain)       General bed mobility comments: patient able to assist with bed mobility rolling both directions but could not tolerate come to upright at EOB due to increased pain in RLE  Transfers                 General transfer comment: unable to perform  Ambulation/Gait                Stairs            Wheelchair Mobility    Modified Rankin (Stroke Patients Only)       Balance                                             Pertinent Vitals/Pain Pain Assessment: Faces Faces Pain Scale: Hurts whole lot Pain Location: RLE Pain Descriptors / Indicators: Grimacing;Guarding;Moaning;Sharp Pain Intervention(s): Limited activity within patient's tolerance;Monitored during  session;Repositioned    Home Living Family/patient expects to be discharged to:: Skilled nursing facility                      Prior Function           Comments: per son, patient was using RW and progressing well at rehab then all of a sudden started to deteriorate with mobility rapidly with reports of significant pain     Hand Dominance   Dominant Hand: Right    Extremity/Trunk Assessment   Upper Extremity Assessment Upper Extremity Assessment: Generalized weakness    Lower Extremity Assessment Lower Extremity Assessment: RLE deficits/detail;Generalized weakness RLE: Unable to fully assess due to pain;Unable to fully assess due to immobilization       Communication   Communication: HOH  Cognition Arousal/Alertness: Awake/alert Behavior During Therapy: WFL for tasks assessed/performed Overall Cognitive Status: Impaired/Different from baseline Area of Impairment: Orientation;Memory;Awareness;Problem solving Orientation Level: Disoriented to;Place;Time;Situation   Memory: Decreased short-term memory     Awareness: Emergent Problem Solving: Slow processing;Difficulty sequencing;Requires verbal cues;Requires tactile cues      General Comments General comments (skin integrity, edema, etc.): hygiene and pericare performed as patient with incontinence of urine and stool    Exercises     Assessment/Plan    PT Assessment Patient needs continued PT services  PT Problem List Decreased strength;Decreased  range of motion;Decreased activity tolerance;Decreased balance;Decreased mobility;Pain          PT Treatment Interventions DME instruction;Gait training;Functional mobility training;Therapeutic activities;Therapeutic exercise;Balance training;Neuromuscular re-education;Cognitive remediation;Patient/family education;Wheelchair mobility training    PT Goals (Current goals can be found in the Care Plan section)  Acute Rehab PT Goals Patient Stated Goal: to not  hurt PT Goal Formulation: With patient Time For Goal Achievement: 07/07/16 Potential to Achieve Goals: Fair    Frequency Min 2X/week   Barriers to discharge        Co-evaluation               End of Session   Activity Tolerance: Patient limited by pain Patient left: in bed;with call bell/phone within reach;with bed alarm set;with family/visitor present Nurse Communication: Mobility status         Time: [id number] PT Time Calculation (min) (ACUTE ONLY): 19 min   Charges:   PT Evaluation $PT Eval Moderate Complexity: 1 Procedure     PT G Codes:        Duncan Dull 07-21-2016, 5:19 PM  Alben Deeds, [place] DPT  [phone]

## 2016-06-24 ENCOUNTER — Inpatient Hospital Stay (HOSPITAL_COMMUNITY): Payer: Medicare Other

## 2016-06-24 DIAGNOSIS — R0602 Shortness of breath: Secondary | ICD-10-CM

## 2016-06-24 LAB — CBC
HEMATOCRIT: 30.2 % — AB (ref 36.0–46.0)
HEMOGLOBIN: 9.5 g/dL — AB (ref 12.0–15.0)
MCH: 27.9 pg (ref 26.0–34.0)
MCHC: 31.5 g/dL (ref 30.0–36.0)
MCV: 88.8 fL (ref 78.0–100.0)
Platelets: 333 10*3/uL (ref 150–400)
RBC: 3.4 MIL/uL — AB (ref 3.87–5.11)
RDW: 14.8 % (ref 11.5–15.5)
WBC: 16 10*3/uL — ABNORMAL HIGH (ref 4.0–10.5)

## 2016-06-24 LAB — BASIC METABOLIC PANEL
ANION GAP: 6 (ref 5–15)
BUN: 28 mg/dL — ABNORMAL HIGH (ref 6–20)
CHLORIDE: 117 mmol/L — AB (ref 101–111)
CO2: 16 mmol/L — AB (ref 22–32)
Calcium: 8.1 mg/dL — ABNORMAL LOW (ref 8.9–10.3)
Creatinine, Ser: 0.81 mg/dL (ref 0.44–1.00)
GFR calc Af Amer: >60 mL/min (ref >60)
GFR calc non Af Amer: 59 mL/min — ABNORMAL LOW (ref >60)
Glucose, Bld: 119 mg/dL — ABNORMAL HIGH (ref 65–99)
POTASSIUM: 4.7 mmol/L (ref 3.5–5.1)
Sodium: 139 mmol/L (ref 135–145)

## 2016-06-24 LAB — MAGNESIUM: Magnesium: 1.7 mg/dL (ref 1.7–2.4)

## 2016-06-24 MED ORDER — CARVEDILOL 3.125 MG PO TABS
3.1250 mg | ORAL_TABLET | Freq: Two times a day (BID) | ORAL | Status: DC
  Administered 2016-06-24 – 2016-06-27 (×6): 3.125 mg via ORAL
  Filled 2016-06-24 (×6): qty 1

## 2016-06-24 MED ORDER — METOPROLOL TARTRATE 5 MG/5ML IV SOLN: 5.0000 mg | INTRAVENOUS | Status: DC | PRN

## 2016-06-24 MED ORDER — FUROSEMIDE 40 MG PO TABS
20.0000 mg | ORAL_TABLET | Freq: Every day | ORAL | Status: DC
  Administered 2016-06-24 – 2016-06-27 (×4): 20 mg via ORAL
  Filled 2016-06-24 (×4): qty 1

## 2016-06-24 MED ORDER — HYDRALAZINE HCL 20 MG/ML IJ SOLN: 10.0000 mg | Freq: Four times a day (QID) | INTRAMUSCULAR | Status: DC | PRN

## 2016-06-24 MED ORDER — AMLODIPINE BESYLATE 10 MG PO TABS
10.0000 mg | ORAL_TABLET | Freq: Every day | ORAL | Status: DC
  Administered 2016-06-24 – 2016-06-27 (×4): 10 mg via ORAL
  Filled 2016-06-24 (×4): qty 1

## 2016-06-24 NOTE — Progress Notes (Addendum)
*  PRELIMINARY RESULTS* Vascular Ultrasound Lower extremity venous duplex has been completed.  Preliminary findings: technically limited study due to body habitus and patient intolerance to venous compressions. Limited views of vein segments bilaterally, however there appears to be acute DVT involving the Right popliteal vein and gastroc veins.    Gave results to Constance Goltz, RN.    Landry Mellow, RDMS, RVT  06/24/2016, 3:50 PM
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
## 2016-06-24 NOTE — Progress Notes (Signed)
I reviewed the records from the outside facility and the orthopedic surgeon abandoned ORIF of the fracture due to lack of distal bone stock for adequate fixation and loose prosthesis.  He felt that she would need a complex knee revision which would be a great deal of stress to her physiologically.  He recommended that she be placed in a knee immobilizer indefinitely.  I agree that there are no great options for the patient at this time.  Reconstruction would entail a massive undertaking that may not give her much benefit.  She is unlikely going to ever ambulate any significant distances if she is to ambulate at all.  I think she will be wheelchair dependent forever.  One possible solution to this is to perform an above knee amputation at the fracture site.  This would be a relatively smaller surgery physiologically, comparatively, and would make transferring and caring for the patient easier for the caretakers and patient.  Nonoperative treatment would leave the patient with a chronically painful and unstable leg.  I will discuss this with the family.    Azucena Cecil, MD [place] 8:51 PM

## 2016-06-24 NOTE — Progress Notes (Signed)
PROGRESS NOTE    Cindy Mills  [id number] DOB: 08-03-1917 DOA: 06/22/2016 PCP: No primary care provider on file.   Brief Narrative:  83 y.o. WF PMHx  HTN, CHF, Breast cancer, Rt Leg DVT not on anticoagulant; Who presented with altered mental status. Family who is present at bedside provide history as patient is currently confused. Patient was previously being cared for at [facility] assisted living facility. Patient had fallen at Thanksgiving and fractured her right leg.   During the hospitalization at [facility] she was also found to have DVT for which it appears that the patient was not placed on any anticoagulation. She was evaluated by orthopedic surgery, but was unable to have surgical correction due to issues with the right knee.   Following the hospitalization she was transferred back to [facility] to the nursing home part, and was placed on pain medications tramadol and fentanyl. Family notes that the facility continue to increase dosage of these medications and as a result the patient had declining function, mental status, and issues with dehydration. They requested multiple times for pain medications to be decreased.   Family after evaluating overall care decided to transfer her from [facility] to [facility]. Family notes that there was found to fentanyl patch discontinued but she had been increased from tramadol 25 mg 100 mg for pain.   However, after being transferred to [facility] patient was found to be semi-conscious and difficult to wake.  EMS was called prior to her being checked in to the nursing home. En route they had to administered Narcan with some improvement of patient mental status. Son who is also patient's POA  changed the DO NOT RESUSCITATE status and wants her to be a full code.   Subjective:  Patient is in bed mildly confused, denies any headache chest or abdominal pain, currently not short of breath, says if  needed she would want to have surgery in her right leg, claims that before her right leg fracture she was walking with a walker.     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 - Hold opioid pain medications  - Needed we can cautiously restart at the Tramadol at 25 mg dose prn   Acute renal failure (Baseline Cr 1.59) Lab Results  Component Value Date   CREATININE 0.81 06/24/2016   CREATININE 1.00 06/23/2016   CREATININE 1.36 (H) 06/23/2016   Resolved  after hydration, serum home dose diuretic, home dose ACE inhibitor still on hold.  Leukocytosis:   Recent Labs Lab 06/22/16 1620 06/23/16 0027 06/23/16 0929 06/24/16 0105  WBC 22.4* 19.7* 17.1* 16.0*  -Trending down , Likely due to UTI  Right leg ulcerations/pressure ulceration - Wound care consult  Right knee/femur fracture  -[facility]bt[facility]  -Requested orthopedics Dr. Erlinda Hong to evaluate the patient. -In case patient needs surgical correction she will be high risk candidate for perioperative cardiopulmonary complications, this was explained clearly to the patient who understands the risks and benefits and would like to proceed for surgery if needed.   Chronic CHF, type unknown no echo in chart - cardiac evaluation prior to the surgical  procedure [facility] his family thinks her EF = 78%. - Strict intake and output  -Daily weight - Obtain records from [facility] requested  DVT:  -DVT in the right lower extremity but was never placed on any anticoagulation. - for now on heparin drip and repeat lower extremity venous ultrasound.  Essential hypertension:  -Blood pressure is elevated, placed on Coreg, Norvasc and as needed hydralazine. For now hold Catapres as it can cause confusion.  Anemia of chronic disease: Stable, monitor     DVT prophylaxis: Lovenox Code Status: Full Family Communication: Son and daughter present Disposition Plan: Evaluation of right leg for surgery/stabilization   Consultants:  NA  Procedures/Significant Events:  1/1 DG tibia/fibula right:-Fractures of the distal femur, proximal fibula and probable/ knee replacement loosening of the proximal anterior aspect of the proximal tibia. -Anterior ulceration of the right lower leg. 1/1 DG right foot:-Lucency at the base of the third and fourth metatarsals and in the distal aspect of the first metatarsal suspicious for fracture.   VENTILATOR SETTINGS: NA    Cultures 1/1 blood positive G VR 1/2 1/1 right leg wound positive few GPC in pairs 1/2 urine pending   Antimicrobials: Anti-infectives    Start     Stop   06/22/16 1845  aztreonam (AZACTAM) 1 g in dextrose 5 % 50 mL IVPB  Status:  Discontinued     06/22/16 1844   06/22/16 1845  aztreonam (AZACTAM) 500 mg in dextrose 5 % 50 mL IVPB             Devices Right leg soft brace   LINES / TUBES:  NA    Continuous Infusions:    Objective: Vitals:   06/24/16 0500 06/24/16 0800 06/24/16 0839 06/24/16 0845  BP:   (!) 186/86 (!) 182/80  Pulse:      Resp:      Temp:  99.3 F (37.4 C)    TempSrc:  Oral    SpO2:      Weight: 82.8 kg (182 lb 8.7 oz)     Height:        Intake/Output Summary (Last 24 hours) at 06/24/16 0941 Last  data filed at 06/24/16 [id number]  Gross per 24 hour  Intake          1396.25 ml  Output                0 ml  Net          1396.25 ml   Filed Weights   06/22/16 1551 06/22/16 2058 06/24/16 0500  Weight: 81.6 kg (180 lb) 82 kg (180 lb 12.4 oz) 82.8 kg (182 lb 8.7 oz)    Examination:  General: A/O 2, NAD, No acute respiratory distress Eyes: negative scleral hemorrhage, negative anisocoria, negative icterus ENT: Negative Runny nose, negative gingival bleeding, Neck:  Negative scars, masses, torticollis, lymphadenopathy, JVD Lungs: Clear to auscultation bilaterally without wheezes or crackles Cardiovascular: Regular rate and rhythm without murmur gallop or rub normal S1 and S2 Abdomen: negative abdominal pain, nondistended, positive soft, bowel sounds, no  rebound, no ascites, no appreciable mass Extremities: RLE in case a soft immobilizer, right leg is externally rotated, +1+ pitting edema right foot, plus clubbing right toes Skin: Positive ulceration lateral aspect right ankle  Psychiatric:  Negative depression, negative anxiety, negative fatigue, negative mania  Central nervous system:  Cranial nerves II through XII intact, tongue/uvula midline, all extremities muscle strength 5/5, sensation intact throughout, negative dysarthria, negative expressive aphasia, negative receptive aphasia.  .     Data Reviewed: Care during the described time interval was provided by me .  I have reviewed this patient's available data, including medical history, events of note, physical examination, and all test results as part of my evaluation. I have personally reviewed and interpreted all radiology studies.  CBC:  Recent Labs Lab 06/22/16 1620 06/22/16 1823 06/23/16 0027 06/23/16 0929 06/24/16 0105  WBC 22.4*  --  19.7* 17.1* 16.0*  NEUTROABS 18.1*  --   --   --   --   HGB 10.4* 11.9* 9.3* 9.6* 9.5*  HCT 34.0* 35.0* 29.6* 30.5* 30.2*  MCV 90.2  --  89.4 88.2 88.8  PLT 358  --  338 310 0000000    Basic Metabolic Panel:  Recent Labs Lab 06/22/16 1620 06/22/16 1823 06/22/16 2109 06/23/16 0027 06/23/16 0929 06/24/16 0105  NA 135 136  --  134* 134* 139  K >7.5* 6.7* 5.5* 5.5*  5.6* 5.9* 4.7  CL 111 110  --  111 114* 117*  CO2 17*  --   --  15* 16* 16*  GLUCOSE 108* 117*  --  156* 117* 119*  BUN 55* 57*  --  52* 44* 28*  CREATININE 1.59* 1.50*  --  1.36* 1.00 0.81  CALCIUM 8.4*  --   --  8.0* 7.9* 8.1*  MG  --   --   --   --   --  1.7   GFR: Estimated Creatinine Clearance: 44.1 mL/min (by C-G formula based on SCr of 0.81 mg/dL). Liver Function Tests:  Recent Labs Lab 06/22/16 1620  AST 24  ALT 16  ALKPHOS 122  BILITOT 0.6  PROT 6.7  ALBUMIN 2.4*   No results for input(s): LIPASE, AMYLASE in the last 168 hours. No results for input(s): AMMONIA in the last 168 hours. Coagulation Profile:  Recent Labs Lab 06/22/16 2156  INR 1.22   Cardiac Enzymes: No results for input(s): CKTOTAL, CKMB, CKMBINDEX, TROPONINI in the last 168 hours. BNP (last 3 results) No results for input(s): PROBNP in the last 8760 hours. HbA1C: No results for input(s): HGBA1C in the last 72 hours. CBG:  Recent Labs Lab 06/22/16 2150  GLUCAP 154*   Lipid Profile: No results for input(s): CHOL, HDL, LDLCALC, TRIG, CHOLHDL, LDLDIRECT in the last 72 hours. Thyroid Function Tests: No results for input(s): TSH, T4TOTAL, FREET4, T3FREE, THYROIDAB in the last 72 hours. Anemia Panel: No results for input(s): VITAMINB12, FOLATE, FERRITIN, TIBC, IRON, RETICCTPCT in the last 72 hours. Urine analysis:    Component Value Date/Time   COLORURINE AMBER (A) 06/22/2016 1758   APPEARANCEUR CLOUDY (A) 06/22/2016 1758   LABSPEC 1.019 06/22/2016 1758   PHURINE 5.0 06/22/2016 1758   GLUCOSEU NEGATIVE 06/22/2016 1758   HGBUR NEGATIVE 06/22/2016 1758   BILIRUBINUR NEGATIVE 06/22/2016 1758   KETONESUR 5 (A) 06/22/2016 1758   PROTEINUR 100 (A) 06/22/2016 1758   NITRITE NEGATIVE 06/22/2016 1758    LEUKOCYTESUR MODERATE (A) 06/22/2016 1758   Sepsis Labs: @LABRCNTIP (procalcitonin:4,lacticidven:4)  ) Recent Results (from the past 240 hour(s))  Culture, blood (routine x 2)     Status: None (Preliminary result)   Collection Time: 06/22/16  5:45 PM  Result Value Ref Range Status   Specimen Description BLOOD LEFT ANTECUBITAL  Final   Special Requests BOTTLES DRAWN AEROBIC AND ANAEROBIC 5CC  Final   Culture  Setup Time   Final    GRAM VARIABLE ROD ANAEROBIC BOTTLE ONLY CRITICAL RESULT CALLED TO, READ BACK BY AND VERIFIED WITH: PHARMD M.MACCIA [id number] MLM    Culture GRAM VARIABLE ROD  Final   Report Status PENDING  Incomplete  Blood Culture ID Panel (Reflexed)     Status: None   Collection Time: 06/22/16  5:45 PM  Result Value Ref Range Status   Enterococcus species NOT DETECTED NOT DETECTED Final   Listeria monocytogenes NOT DETECTED NOT DETECTED Final   Staphylococcus species NOT DETECTED NOT DETECTED Final   Staphylococcus aureus NOT DETECTED NOT DETECTED Final   Streptococcus species NOT DETECTED NOT DETECTED Final   Streptococcus agalactiae NOT DETECTED NOT DETECTED Final   Streptococcus pneumoniae NOT DETECTED NOT DETECTED Final   Streptococcus pyogenes NOT DETECTED NOT DETECTED Final   Acinetobacter baumannii NOT DETECTED NOT DETECTED Final   Enterobacteriaceae species NOT DETECTED NOT DETECTED Final   Enterobacter cloacae complex NOT DETECTED NOT DETECTED Final   Escherichia coli NOT DETECTED NOT DETECTED Final   Klebsiella oxytoca NOT DETECTED NOT DETECTED Final   Klebsiella pneumoniae NOT DETECTED NOT DETECTED Final   Proteus species NOT DETECTED NOT DETECTED Final   Serratia marcescens NOT DETECTED NOT DETECTED Final   Haemophilus influenzae NOT DETECTED NOT DETECTED Final   Neisseria meningitidis NOT DETECTED NOT DETECTED Final   Pseudomonas aeruginosa NOT DETECTED NOT DETECTED Final   Candida albicans NOT DETECTED NOT DETECTED Final   Candida glabrata NOT  DETECTED NOT DETECTED Final   Candida krusei NOT DETECTED NOT DETECTED Final   Candida parapsilosis NOT DETECTED NOT DETECTED Final   Candida tropicalis NOT DETECTED NOT DETECTED Final  Wound or Superficial Culture     Status: None (Preliminary result)   Collection Time: 06/22/16  6:49 PM  Result Value Ref Range Status   Specimen Description LEG RIGHT  Final   Special Requests NONE  Final   Gram Stain   Final    FEW WBC PRESENT, PREDOMINANTLY PMN FEW GRAM POSITIVE COCCI IN PAIRS    Culture PENDING  Incomplete   Report Status PENDING  Incomplete  MRSA PCR Screening     Status: None   Collection Time: 06/22/16  8:52 PM  Result Value Ref Range Status   MRSA by PCR NEGATIVE NEGATIVE Final    Comment:        The GeneXpert MRSA Assay (FDA approved for NASAL specimens only), is one component of a comprehensive MRSA colonization surveillance program. It is not intended to diagnose MRSA infection nor to guide or monitor treatment for MRSA infections.   Culture, blood (routine x 2)     Status: None (Preliminary result)   Collection Time: 06/22/16  9:13 PM  Result Value Ref Range Status   Specimen Description BLOOD RIGHT ANTECUBITAL  Final   Special Requests IN PEDIATRIC BOTTLE 1.5CC  Final   Culture NO GROWTH < 24 HOURS  Final   Report Status PENDING  Incomplete         Radiology Studies: Dg Chest 2 View  Result Date: 06/22/2016 CLINICAL DATA:  History of congestive heart failure and hypertension. Generalized right lower leg pain and heel pain. EXAM:  CHEST  2 VIEW COMPARISON:  None. FINDINGS: The aorta is tortuous. The heart size is enlarged. There is a 6 mm nodule in the right upper lobe. There is no focal pneumonia, pulmonary edema, or pleural effusion. There is kyphosis of spine. Degenerative joint changes of bilateral shoulders and the spine are identified. IMPRESSION: Cardiomegaly.  No focal abnormality identified within the lungs. 6 mm nodule in the right upper lobe. Further  evaluation with a chest CT on outpatient basis is recommended. Electronically Signed   By: Abelardo Diesel M.D.   On: 06/22/2016 17:18   Dg Tibia/fibula Right  Result Date: 06/22/2016 CLINICAL DATA:  Right leg and heel pain. EXAM: RIGHT TIBIA AND FIBULA - 2 VIEW COMPARISON:  None. FINDINGS: There is extensive comminuted displaced fracture of the distal femur. There is displaced fracture of the proximal fibula. There is probable fracture/ knee replacement loosening of the proximal anterior aspect of the proximal tibia. There is no bony destruction to suggest osteomyelitis. There is anterior ulceration of the lower right anterior leg. IMPRESSION: Fractures of the distal femur, proximal fibula and probable/ knee replacement loosening of the proximal anterior aspect of the proximal tibia. Anterior ulceration of the right lower leg. No evidence of osteomyelitis. Electronically Signed   By: Abelardo Diesel M.D.   On: 06/22/2016 17:25   Dg Foot Complete Right  Result Date: 06/22/2016 CLINICAL DATA:  Ulcers on the right leg and right heel. EXAM: RIGHT FOOT COMPLETE - 3+ VIEW COMPARISON:  None. FINDINGS: There is curvilinear lucency at the base of the third and fourth metatarsals suspicious for fracture. There is cortical discontinuity in the distal aspect of the first metatarsal suspicious for fracture. There is no bony destruction to suggest osteomyelitis. There is diffuse osteopenia. Degenerative joint changes throughout the right foot are noted. IMPRESSION: No evidence of osteomyelitis. Lucency at the base of the third and fourth metatarsals and in the distal aspect of the first metatarsal suspicious for fracture. Electronically Signed   By: Abelardo Diesel M.D.   On: 06/22/2016 17:21    Scheduled Meds: . amLODipine  10 mg Oral Daily  . aspirin  325 mg Oral Daily  . aztreonam  500 mg Intravenous Q8H  . budesonide  0.25 mg Nebulization BID  . carvedilol  3.125 mg Oral BID WC  . collagenase   Topical Daily  .  enoxaparin (LOVENOX) injection  40 mg Subcutaneous Q24H  . feeding supplement (PRO-STAT SUGAR FREE 64)  30 mL Oral TID WC  . furosemide  20 mg Oral Daily  . oxybutynin  5 mg Oral Daily  . pantoprazole  40 mg Oral Daily  . sodium chloride flush  3 mL Intravenous Q12H   Continuous Infusions:    LOS: 2 days    Time spent: 40 minutes  Thurnell Lose, MD [facility] Pager [phone]  If 7PM-7AM, please contact night-coverage [email or web address] Password [email or web address] 06/24/2016, 9:41 AM

## 2016-06-25 DIAGNOSIS — S72011A Unspecified intracapsular fracture of right femur, initial encounter for closed fracture: Secondary | ICD-10-CM

## 2016-06-25 DIAGNOSIS — I82492 Acute embolism and thrombosis of other specified deep vein of left lower extremity: Secondary | ICD-10-CM

## 2016-06-25 LAB — CBC
HEMATOCRIT: 31.4 % — AB (ref 36.0–46.0)
Hemoglobin: 9.7 g/dL — ABNORMAL LOW (ref 12.0–15.0)
MCH: 27.2 pg (ref 26.0–34.0)
MCHC: 30.9 g/dL (ref 30.0–36.0)
MCV: 88.2 fL (ref 78.0–100.0)
Platelets: 360 10*3/uL (ref 150–400)
RBC: 3.56 MIL/uL — ABNORMAL LOW (ref 3.87–5.11)
RDW: 14.5 % (ref 11.5–15.5)
WBC: 17.1 10*3/uL — ABNORMAL HIGH (ref 4.0–10.5)

## 2016-06-25 LAB — CULTURE, BLOOD (ROUTINE X 2)

## 2016-06-25 LAB — BASIC METABOLIC PANEL
Anion gap: 7 (ref 5–15)
BUN: 18 mg/dL (ref 6–20)
CALCIUM: 8 mg/dL — AB (ref 8.9–10.3)
CHLORIDE: 114 mmol/L — AB (ref 101–111)
CO2: 18 mmol/L — ABNORMAL LOW (ref 22–32)
CREATININE: 0.74 mg/dL (ref 0.44–1.00)
GFR calc Af Amer: >60 mL/min (ref >60)
Glucose, Bld: 109 mg/dL — ABNORMAL HIGH (ref 65–99)
Potassium: 3.7 mmol/L (ref 3.5–5.1)
SODIUM: 139 mmol/L (ref 135–145)

## 2016-06-25 LAB — AEROBIC CULTURE  (SUPERFICIAL SPECIMEN)

## 2016-06-25 LAB — AEROBIC CULTURE W GRAM STAIN (SUPERFICIAL SPECIMEN)

## 2016-06-25 LAB — MAGNESIUM: MAGNESIUM: 1.6 mg/dL — AB (ref 1.7–2.4)

## 2016-06-25 MED ORDER — VANCOMYCIN HCL 10 G IV SOLR
1500.0000 mg | Freq: Once | INTRAVENOUS | Status: AC
  Administered 2016-06-25: 1500 mg via INTRAVENOUS
  Filled 2016-06-25: qty 1500

## 2016-06-25 MED ORDER — DEXTROSE 5 % IV SOLN
2.0000 g | Freq: Three times a day (TID) | INTRAVENOUS | Status: DC
  Administered 2016-06-25 – 2016-06-27 (×6): 2 g via INTRAVENOUS
  Filled 2016-06-25 (×7): qty 2

## 2016-06-25 MED ORDER — VANCOMYCIN HCL IN DEXTROSE 750-5 MG/150ML-% IV SOLN
750.0000 mg | Freq: Two times a day (BID) | INTRAVENOUS | Status: DC
  Administered 2016-06-26 – 2016-06-27 (×3): 750 mg via INTRAVENOUS
  Filled 2016-06-25 (×4): qty 150

## 2016-06-25 MED ORDER — TRAMADOL HCL 50 MG PO TABS
50.0000 mg | ORAL_TABLET | Freq: Two times a day (BID) | ORAL | Status: DC | PRN
  Administered 2016-06-27: 50 mg via ORAL
  Filled 2016-06-25: qty 1

## 2016-06-25 NOTE — Progress Notes (Signed)
Pharmacy Antibiotic Note  Cindy Mills is a 83 y.o. female admitted on 06/22/2016 with wound infection.  Pharmacy has been consulted for vancomycin dosing. Patient currently on Aztreonam for an pseudomonas in leg wound. Now the leg wound is also growing MRSA. Leukocytosis is elevated. She remains afebrile. SCr is improving with current SCr 0.74 with estimated CrCl ~45 mL/min.   Plan: Vancomycin 1500mg , then 750mg  IV every 12 hours.  Goal trough 15-20 mcg/mL.  Follow-up vancomycin trough at [facility]. Monitor renal function, clinical status, and culture results.   Height: 5\' 6"  (167.6 cm) Weight: 186 lb 1.1 oz (84.4 kg) IBW/kg (Calculated) : 59.3  Temp (24hrs), Avg:98.2 F (36.8 C), Min:97.8 F (36.6 C), Max:98.5 F (36.9 C)   Recent Labs Lab 06/22/16 1620 06/22/16 1823 06/22/16 1824 06/23/16 0027 06/23/16 0929 06/24/16 0105 06/25/16 0310  WBC 22.4*  --   --  19.7* 17.1* 16.0* 17.1*  CREATININE 1.59* 1.50*  --  1.36* 1.00 0.81 0.74  LATICACIDVEN  --   --  1.37  --   --   --   --     Estimated Creatinine Clearance: 45 mL/min (by C-G formula based on SCr of 0.74 mg/dL).    Allergies  Allergen Reactions  . Penicillins Anaphylaxis    Family does not have any additional information  . Amoxicillin Other (See Comments)    Unknown reaction per MAR  . Sulfa Antibiotics Other (See Comments)    Antimicrobials this admission: Aztreonam 1/1 >> Vancomycin 1/4 >>  Dose adjustments this admission:   Microbiology results: 1/1 Blood Culture >> 1/2 Bacillus species (BCID negative) 1/1 Leg Wound Culture >> Pseudomonas (pan sensitive) and MRSA (R to Bactrim, Sens to Vanc, Doxy) 1/1 MRSA PCR: negative  Thank you for allowing pharmacy to be a part of this patient's care.  Sloan Leiter, PharmD, BCPS Clinical Pharmacist [phone] until 11 PM tonight [phone] after hours 06/25/2016 4:58 PM

## 2016-06-25 NOTE — Progress Notes (Signed)
Physical Therapy Treatment Patient Details Name: Cindy Mills MRN: [id number] DOB: Oct 03, 1917 Today's Date: 06/25/2016    History of Present Illness 83 y.o.femalewith medical history significant of HTN, CHF, breast cancer, and dvt; who presented with altered mental status from [facility] home. Patient was transfer to [facility] from [facility] where she suffered fall with RLE fracture, non-operable.    PT Comments    Pt presented supine in bed with HOB elevated, awake and willing to participate in therapy session. Pt continues to require max A x2 with bed mobility and mod-max A to maintain upright sitting position. Pt very limited secondary to fatigue and pain this session. All VSS throughout. Pt would continue to benefit from skilled physical therapy services at this time while admitted and after d/c to address her limitations in order to improve her overall safety and independence with functional mobility.    Follow Up Recommendations  SNF;Supervision/Assistance - 24 hour     Equipment Recommendations  None recommended by PT;Other (comment) (defer to next venue)    Recommendations for Other Services       Precautions / Restrictions Precautions Precautions: Fall Required Braces or Orthoses: Knee Immobilizer - Right Restrictions Weight Bearing Restrictions: Yes RLE Weight Bearing: Non weight bearing    Mobility  Bed Mobility Overal bed mobility: Needs Assistance Bed Mobility: Sit to Supine;Supine to Sit     Supine to sit: Max assist;+2 for physical assistance Sit to supine: Max assist;+2 for physical assistance   General bed mobility comments: pt required increased time, use of bed rails with tactile and verbal cueing and max A x2 with use of bed pads to position hips to sit EOB. Max A x2 to return to supine as well.  Transfers                 General transfer comment: unable to perform at this time secondary to heavy posterior lean in sitting at EOB and  pt with increased pain and fatigue  Ambulation/Gait                 Stairs            Wheelchair Mobility    Modified Rankin (Stroke Patients Only)       Balance Overall balance assessment: Needs assistance Sitting-balance support: Feet supported;Bilateral upper extremity supported Sitting balance-Leahy Scale: Poor Sitting balance - Comments: pt required mod-max A to maintain upright sitting at EOB  Postural control: Posterior lean                          Cognition Arousal/Alertness: Awake/alert Behavior During Therapy: WFL for tasks assessed/performed Overall Cognitive Status: Within Functional Limits for tasks assessed                      Exercises      General Comments        Pertinent Vitals/Pain Pain Assessment: Faces Faces Pain Scale: Hurts even more Pain Location: RLE Pain Descriptors / Indicators: Grimacing;Guarding;Moaning;Sharp Pain Intervention(s): Monitored during session;Repositioned    Home Living                      Prior Function            PT Goals (current goals can now be found in the care plan section) Acute Rehab PT Goals Patient Stated Goal: to not hurt PT Goal Formulation: With patient Time For Goal Achievement: 07/07/16  Potential to Achieve Goals: Fair Progress towards PT goals: Progressing toward goals    Frequency    Min 2X/week      PT Plan Current plan remains appropriate    Co-evaluation             End of Session   Activity Tolerance: Patient limited by pain;Patient limited by fatigue Patient left: in bed;with call bell/phone within reach;with bed alarm set     Time: 0945-1005 PT Time Calculation (min) (ACUTE ONLY): 20 min  Charges:  $Therapeutic Activity: 8-22 mins                    G CodesClearnce Sorrel Melis Trochez 07/09/2016, 10:37 AM Sherie Don, PT, DPT [phone]

## 2016-06-25 NOTE — Progress Notes (Signed)
PROGRESS NOTE    Cindy Mills  [id number] DOB: [birth year] DOA: 06/22/2016 PCP: No primary care provider on file.   Brief Narrative:  83 y.o. WF PMHx  HTN, CHF, Breast cancer, Rt Leg DVT not on anticoagulant;   Who presented with altered mental status. Family who is present at bedside provide history as patient is currently confused. Patient was previously being cared for at [facility] assisted living facility. Patient had fallen at Thanksgiving and fractured her right leg. During the hospitalization at [facility] she was also found to have DVT for which it appears that the patient was not placed on any anticoagulation. She was evaluated by orthopedic surgery, but was unable to have surgical correction due to issues with the right knee. Following the hospitalization she was transferred back to [facility] to the nursing home part, and was placed on pain medications tramadol and fentanyl. Family notes that the facility continue to increase dosage of these medications and as a result the patient had declining function, mental status, and issues with dehydration. They requested multiple times for pain medications to be decreased. Family after evaluating overall care decided to transfer her from [facility] to [facility]. Family notes that there was found to fentanyl patch discontinued but she had been increased from tramadol 25 mg 100 mg for pain. However, after being transferred to [facility] home today patient was found to be semi-conscious and difficult to wake.  EMS was called prior to her being checked in to the nursing home. En route they had to administered Narcan with some improvement of patient mental status. Son who is also patient's POA  changed the DO NOT RESUSCITATE status and wants her to be a full code.   Subjective: 1/4 patient sleeping comfortably.    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7* 17.1* 16.0* 17.1*  -Trending down   Right leg ulcerations/pressure ulceration - Wound care consult  Right knee/femur fracture/ wound positive Pseudomonas/MRSA -Obtain records from [facility]  -On 1/3 consult orthopedic surgery for second opinion. Repair/stabilization of knee/right femur. Per note have offered AKA to patient and family. Awaiting decision. -Continue aztreonam will start vancomycin per pharmacy  CHF - cardiac evaluation prior to the surgical procedure at [facility] his family thinks her EF = 78%. - Strict intake and output Since admission +3.8 L -Daily weight Filed Weights   06/24/16 0500 06/25/16 0400 06/25/16 0912  Weight:  82.8 kg (182 lb 8.7 oz) 82.1 kg (181 lb) 84.4 kg (186 lb 1.1 oz)  - Obtain records from [facility] requested -Amlodipine 10 mg daily -Coreg 3.125 mg BID -Lasix 20 mg daily  DVT Right lower extremity:  -DVT in the right lower extremity but was never placed on any  anticoagulation. - Question reasoning for patient not being placed on anticoagulation. Secondary to surgery? Currently on prophylactic Lovenox  Essential hypertension:  -Patient initially found to have borderline blood pressures on admission. - Hold the clonidine patch, lisinopril, and Lasix - See CHF  Anemia:  -Hemoglobin 10.4. Patient with normal MCV and MCH. Recent Labs Lab 06/22/16 1620 06/22/16 1823 06/23/16 0027 06/23/16 0929 06/24/16 0105 06/25/16 0310  HGB 10.4* 11.9* 9.3* 9.6* 9.5* 9.7*  -Stable   Pain control -Would use Tylenol first, then tramadol. Be very judicious in use of pain medication per patient's chart very sensitive to medication.     DVT prophylaxis: Lovenox Code Status: Full Family Communication: None Disposition Plan: Evaluation of right leg for surgery/stabilization   Consultants:  NA  Procedures/Significant Events:  1/1 DG tibia/fibula right:-Fractures of the distal femur, proximal fibula and probable/ knee replacement loosening of the proximal anterior aspect of the proximal tibia. -Anterior ulceration of the right lower leg. 1/1 DG right foot:-Lucency at the base of the third and fourth metatarsals and in the distal aspect of the first metatarsal suspicious for fracture.   VENTILATOR SETTINGS: NA    Cultures 1/1 blood positive Bacillus 1/2 (most likely contaminant) 1/1 right leg wound positive Pseudomonas/MRSA 1/2 urine pending   Antimicrobials: Anti-infectives    Start     Stop   06/26/16 0515  vancomycin (VANCOCIN) IVPB 750 mg/150 ml premix         06/25/16 1715  vancomycin (VANCOCIN) 1,500 mg in sodium chloride 0.9 % 500 mL IVPB         06/25/16 1400  aztreonam (AZACTAM) 2 g in dextrose 5 % 50 mL IVPB         06/22/16 1845  aztreonam (AZACTAM) 1 g in dextrose 5 % 50 mL IVPB  Status:  Discontinued     06/22/16 1844   06/22/16 1845  aztreonam (AZACTAM) 500 mg in dextrose 5 % 50 mL IVPB  Status:  Discontinued      06/25/16 1357       Devices Right leg soft brace   LINES / TUBES:  NA    Continuous Infusions:    Objective: Vitals:   06/25/16 0400 06/25/16 0816 06/25/16 0912 06/25/16 1055  BP: (!) 108/53 (!) 152/69  115/66  Pulse: 62     Resp: 17     Temp: 97.9 F (36.6 C) 98.1 F (36.7 C)    TempSrc: Axillary Oral    SpO2: 100%     Weight: 82.1 kg (181 lb)  84.4 kg (186 lb 1.1 oz)   Height:        Intake/Output Summary (Last 24 hours) at 06/25/16 1147 Last data filed at 06/25/16 1057  Gross per 24 hour  Intake              513 ml  Output                0 ml  Net              513 ml   Filed Weights   06/24/16 0500 06/25/16 0400 06/25/16 0912  Weight: 82.8 kg (182 lb 8.7 oz) 82.1  kg (181 lb) 84.4 kg (186 lb 1.1 oz)    Examination:  General: Sleeping peacefully, No acute respiratory distress Eyes: negative scleral hemorrhage, negative anisocoria, negative icterus ENT: Negative Runny nose, negative gingival bleeding, Neck:  Negative scars, masses, torticollis, lymphadenopathy, JVD Lungs: Clear to auscultation bilaterally without wheezes or crackles Cardiovascular: Regular rate and rhythm without murmur gallop or rub normal S1 and S2 Abdomen: negative abdominal pain, nondistended, positive soft, bowel sounds, no rebound, no ascites, no appreciable mass Extremities: RLE in case a soft immobilizer, +1+ pitting edema right foot, plus clubbing right toes Skin: Positive ulceration lateral aspect right ankle  Psychiatric:  Negative depression, negative anxiety, negative fatigue, negative mania  Central nervous system:  Cranial nerves II through XII intact, tongue/uvula midline, all extremities muscle strength 5/5, sensation intact throughout, negative dysarthria, negative expressive aphasia, negative receptive aphasia.  .     Data Reviewed: Care during the described time interval was provided by me .  I have reviewed this patient's available data, including medical history,  events of note, physical examination, and all test results as part of my evaluation. I have personally reviewed and interpreted all radiology studies.  CBC:  Recent Labs Lab 06/22/16 1620 06/22/16 1823 06/23/16 0027 06/23/16 0929 06/24/16 0105 06/25/16 0310  WBC 22.4*  --  19.7* 17.1* 16.0* 17.1*  NEUTROABS 18.1*  --   --   --   --   --   HGB 10.4* 11.9* 9.3* 9.6* 9.5* 9.7*  HCT 34.0* 35.0* 29.6* 30.5* 30.2* 31.4*  MCV 90.2  --  89.4 88.2 88.8 88.2  PLT 358  --  338 310 333 [id number]   Basic Metabolic Panel:  Recent Labs Lab 06/22/16 1620 06/22/16 1823 06/22/16 2109 06/23/16 0027 06/23/16 0929 06/24/16 0105 06/25/16 0310  NA 135 136  --  134* 134* 139 139  K >7.5* 6.7* 5.5* 5.5*  5.6* 5.9* 4.7 3.7  CL 111 110  --  111 114* 117* 114*  CO2 17*  --   --  15* 16* 16* 18*  GLUCOSE 108* 117*  --  156* 117* 119* 109*  BUN 55* 57*  --  52* 44* 28* 18  CREATININE 1.59* 1.50*  --  1.36* 1.00 0.81 0.74  CALCIUM 8.4*  --   --  8.0* 7.9* 8.1* 8.0*  MG  --   --   --   --   --  1.7 1.6*   GFR: Estimated Creatinine Clearance: 45 mL/min (by C-G formula based on SCr of 0.74 mg/dL). Liver Function Tests:  Recent Labs Lab 06/22/16 1620  AST 24  ALT 16  ALKPHOS 122  BILITOT 0.6  PROT 6.7  ALBUMIN 2.4*   No results for input(s): LIPASE, AMYLASE in the last 168 hours. No results for input(s): AMMONIA in the last 168 hours. Coagulation Profile:  Recent Labs Lab 06/22/16 2156  INR 1.22   Cardiac Enzymes: No results for input(s): CKTOTAL, CKMB, CKMBINDEX, TROPONINI in the last 168 hours. BNP (last 3 results) No results for input(s): PROBNP in the last 8760 hours. HbA1C: No results for input(s): HGBA1C in the last 72 hours. CBG:  Recent Labs Lab 06/22/16 2150  GLUCAP 154*   Lipid Profile: No results for input(s): CHOL, HDL, LDLCALC, TRIG, CHOLHDL, LDLDIRECT in the last 72 hours. Thyroid Function Tests: No results for input(s): TSH, T4TOTAL, FREET4, T3FREE, THYROIDAB in  the last 72 hours. Anemia Panel: No results for input(s): VITAMINB12, FOLATE, FERRITIN, TIBC, IRON, RETICCTPCT in the last 72 hours.  Urine analysis:    Component Value Date/Time   COLORURINE AMBER (A) 06/22/2016 1758   APPEARANCEUR CLOUDY (A) 06/22/2016 1758   LABSPEC 1.019 06/22/2016 1758   PHURINE 5.0 06/22/2016 1758   GLUCOSEU NEGATIVE 06/22/2016 1758   HGBUR NEGATIVE 06/22/2016 1758   BILIRUBINUR NEGATIVE 06/22/2016 1758   KETONESUR 5 (A) 06/22/2016 1758   PROTEINUR 100 (A) 06/22/2016 1758   NITRITE NEGATIVE 06/22/2016 1758   LEUKOCYTESUR MODERATE (A) 06/22/2016 1758   Sepsis Labs: @LABRCNTIP (procalcitonin:4,lacticidven:4)  ) Recent Results (from the past 240 hour(s))  Culture, blood (routine x 2)     Status: Abnormal   Collection Time: 06/22/16  5:45 PM  Result Value Ref Range Status   Specimen Description BLOOD LEFT ANTECUBITAL  Final   Special Requests BOTTLES DRAWN AEROBIC AND ANAEROBIC 5CC  Final   Culture  Setup Time   Final    GRAM VARIABLE ROD ANAEROBIC BOTTLE ONLY CRITICAL RESULT CALLED TO, READ BACK BY AND VERIFIED WITH: PHARMD M.MACCIA [id number] MLM    Culture (A)  Final    BACILLUS SPECIES Standardized susceptibility testing for this organism is not available.    Report Status 06/25/2016 FINAL  Final  Blood Culture ID Panel (Reflexed)     Status: None   Collection Time: 06/22/16  5:45 PM  Result Value Ref Range Status   Enterococcus species NOT DETECTED NOT DETECTED Final   Listeria monocytogenes NOT DETECTED NOT DETECTED Final   Staphylococcus species NOT DETECTED NOT DETECTED Final   Staphylococcus aureus NOT DETECTED NOT DETECTED Final   Streptococcus species NOT DETECTED NOT DETECTED Final   Streptococcus agalactiae NOT DETECTED NOT DETECTED Final   Streptococcus pneumoniae NOT DETECTED NOT DETECTED Final   Streptococcus pyogenes NOT DETECTED NOT DETECTED Final   Acinetobacter baumannii NOT DETECTED NOT DETECTED Final   Enterobacteriaceae species  NOT DETECTED NOT DETECTED Final   Enterobacter cloacae complex NOT DETECTED NOT DETECTED Final   Escherichia coli NOT DETECTED NOT DETECTED Final   Klebsiella oxytoca NOT DETECTED NOT DETECTED Final   Klebsiella pneumoniae NOT DETECTED NOT DETECTED Final   Proteus species NOT DETECTED NOT DETECTED Final   Serratia marcescens NOT DETECTED NOT DETECTED Final   Haemophilus influenzae NOT DETECTED NOT DETECTED Final   Neisseria meningitidis NOT DETECTED NOT DETECTED Final   Pseudomonas aeruginosa NOT DETECTED NOT DETECTED Final   Candida albicans NOT DETECTED NOT DETECTED Final   Candida glabrata NOT DETECTED NOT DETECTED Final   Candida krusei NOT DETECTED NOT DETECTED Final   Candida parapsilosis NOT DETECTED NOT DETECTED Final   Candida tropicalis NOT DETECTED NOT DETECTED Final  Wound or Superficial Culture     Status: None (Preliminary result)   Collection Time: 06/22/16  6:49 PM  Result Value Ref Range Status   Specimen Description LEG RIGHT  Final   Special Requests NONE  Final   Gram Stain   Final    FEW WBC PRESENT, PREDOMINANTLY PMN FEW GRAM POSITIVE COCCI IN PAIRS    Culture FEW PSEUDOMONAS AERUGINOSA  Final   Report Status PENDING  Incomplete   Organism ID, Bacteria PSEUDOMONAS AERUGINOSA  Final      Susceptibility   Pseudomonas aeruginosa - MIC*    CEFTAZIDIME 4 SENSITIVE Sensitive     CIPROFLOXACIN <=0.25 SENSITIVE Sensitive     GENTAMICIN <=1 SENSITIVE Sensitive     IMIPENEM <=0.25 SENSITIVE Sensitive     PIP/TAZO 8 SENSITIVE Sensitive     CEFEPIME 2 SENSITIVE Sensitive     * FEW  PSEUDOMONAS AERUGINOSA  MRSA PCR Screening     Status: None   Collection Time: 06/22/16  8:52 PM  Result Value Ref Range Status   MRSA by PCR NEGATIVE NEGATIVE Final    Comment:        The GeneXpert MRSA Assay (FDA approved for NASAL specimens only), is one component of a comprehensive MRSA colonization surveillance program. It is not intended to diagnose MRSA infection nor to guide  or monitor treatment for MRSA infections.   Culture, blood (routine x 2)     Status: None (Preliminary result)   Collection Time: 06/22/16  9:13 PM  Result Value Ref Range Status   Specimen Description BLOOD RIGHT ANTECUBITAL  Final   Special Requests IN PEDIATRIC BOTTLE 1.5CC  Final   Culture NO GROWTH 1 DAY  Final   Report Status PENDING  Incomplete         Radiology Studies: Dg Femur, Min 2 Views Right  Result Date: 06/24/2016 CLINICAL DATA:  Right distal femur fracture EXAM: RIGHT FEMUR 2 VIEWS COMPARISON:  06/22/2016 right tibia/fibula radiographs. FINDINGS: There is a comminuted periprosthetic fracture in the distal right femoral metaphysis, with 4 cm posterior displacement of the dominant distal fracture fragment. There is an intramedullary rod in the right femoral shaft with interlocking proximal right femoral neck pin and 2 distal interlocking screws, with healed deformity in the mid right femoral shaft in no evidence of hardware fracture or loosening. There is a right total knee arthroplasty with no dislocation at the right distal femoral and right proximal tibial prosthetic articulation. There is a large enthesophyte at the inferior right patella. No dislocation at the right hip joint. Diffuse osteopenia. Prominent vascular calcifications throughout the soft tissues. No suspicious focal osseous lesions. IMPRESSION: Comminuted periprosthetic fracture in the distal right femoral metaphysis with displacement as described. Postsurgical changes from right total knee arthroplasty and prior ORIF in the right femoral shaft with no evidence of hardware fracture or loosening. Electronically Signed   By: Ilona Sorrel M.D.   On: 06/24/2016 10:05   Dg Knee 2 Views Right  Result Date: 06/24/2016 CLINICAL DATA:  Right distal femur fracture EXAM: RIGHT KNEE - 3 VIEW COMPARISON:  06/22/2016 right tibia/ fibula radiographs. 06/24/2016 right femur radiographs. FINDINGS: Status post right total knee  arthroplasty. No dislocation at the articulation of the right distal femoral and right proximal tibial arthroplasty prosthetic components. Partially visualized intramedullary rod with 2 distal interlocking screws in the right distal femoral shaft. Comminuted periprosthetic fracture in the right distal femoral metaphysis with approximately 4 cm posterior displacement of the dominant distal fracture fragment. Diffuse osteopenia. No suspicious focal osseous lesions. Large enthesophyte at the inferior right patella. Vascular calcifications throughout the posterior soft tissues. IMPRESSION: Comminuted periprosthetic fracture in the right distal femoral metaphysis with displacement as described. Postsurgical changes from right total knee arthroplasty and prior ORIF in the right femoral shaft. Diffuse osteopenia. Electronically Signed   By: Ilona Sorrel M.D.   On: 06/24/2016 10:07        Scheduled Meds: . amLODipine  10 mg Oral Daily  . aspirin  325 mg Oral Daily  . aztreonam  500 mg Intravenous Q8H  . budesonide  0.25 mg Nebulization BID  . carvedilol  3.125 mg Oral BID WC  . collagenase   Topical Daily  . enoxaparin (LOVENOX) injection  40 mg Subcutaneous Q24H  . feeding supplement (PRO-STAT SUGAR FREE 64)  30 mL Oral TID WC  . furosemide  20 mg Oral  Daily  . oxybutynin  5 mg Oral Daily  . pantoprazole  40 mg Oral Daily  . sodium chloride flush  3 mL Intravenous Q12H   Continuous Infusions:    LOS: 3 days    Time spent: 40 minutes    Beretta Ginsberg, Geraldo Docker, MD [facility] Pager [phone]   If 7PM-7AM, please contact night-coverage [email or web address] Password [email or web address] 06/25/2016, 11:47 AM

## 2016-06-25 NOTE — Progress Notes (Signed)
PHARMACY NOTE:  ANTIMICROBIAL RENAL DOSAGE ADJUSTMENT  Current antimicrobial regimen includes a mismatch between antimicrobial dosage and estimated renal function.  As per policy approved by the Pharmacy & Therapeutics and Medical Executive Committees, the antimicrobial dosage will be adjusted accordingly.  Current antimicrobial dosage:  Azactam 500 mg IV every 8 hours  Indication: UTI >> however now also covering for RLE wound wit Pseudomonas  Renal Function:  Estimated Creatinine Clearance: 45 mL/min (by C-G formula based on SCr of 0.74 mg/dL). []      On intermittent HD, scheduled: []      On CRRT    Antimicrobial dosage has been changed to:  Azactam 2g IV every 8 hours  Additional comments:  It appears as if Azactam was initially dosed for UTI coverage however now the RLE wound is growing Pseudomonas. To cover both adequately - will increase the dose of Azactam.   Thank you for allowing pharmacy to be a part of this patient's care.  Alycia Rossetti, PharmD, BCPS Clinical Pharmacist Pager: [phone] Clinical phone for 06/25/2016 from 7a-3:30p: [phone] If after 3:30p, please call main pharmacy at: [phone] 06/25/2016 1:56 PM

## 2016-06-26 LAB — BASIC METABOLIC PANEL
ANION GAP: 7 (ref 5–15)
BUN: 24 mg/dL — ABNORMAL HIGH (ref 6–20)
CALCIUM: 7.6 mg/dL — AB (ref 8.9–10.3)
CO2: 18 mmol/L — AB (ref 22–32)
Chloride: 112 mmol/L — ABNORMAL HIGH (ref 101–111)
Creatinine, Ser: 0.68 mg/dL (ref 0.44–1.00)
GFR calc non Af Amer: >60 mL/min (ref >60)
Glucose, Bld: 136 mg/dL — ABNORMAL HIGH (ref 65–99)
POTASSIUM: 3.8 mmol/L (ref 3.5–5.1)
Sodium: 137 mmol/L (ref 135–145)

## 2016-06-26 LAB — CBC
HEMATOCRIT: 29.6 % — AB (ref 36.0–46.0)
Hemoglobin: 9.3 g/dL — ABNORMAL LOW (ref 12.0–15.0)
MCH: 27.5 pg (ref 26.0–34.0)
MCHC: 31.4 g/dL (ref 30.0–36.0)
MCV: 87.6 fL (ref 78.0–100.0)
Platelets: 352 10*3/uL (ref 150–400)
RBC: 3.38 MIL/uL — AB (ref 3.87–5.11)
RDW: 14.5 % (ref 11.5–15.5)
WBC: 17.4 10*3/uL — AB (ref 4.0–10.5)

## 2016-06-26 LAB — MAGNESIUM: Magnesium: 1.4 mg/dL — ABNORMAL LOW (ref 1.7–2.4)

## 2016-06-26 MED ORDER — MEGESTROL ACETATE 400 MG/10ML PO SUSP
400.0000 mg | Freq: Every day | ORAL | Status: DC
  Administered 2016-06-26 – 2016-06-27 (×2): 400 mg via ORAL
  Filled 2016-06-26 (×2): qty 10

## 2016-06-26 MED ORDER — MAGNESIUM SULFATE 2 GM/50ML IV SOLN
2.0000 g | Freq: Once | INTRAVENOUS | Status: AC
  Administered 2016-06-26: 2 g via INTRAVENOUS
  Filled 2016-06-26: qty 50

## 2016-06-26 NOTE — Progress Notes (Signed)
Discussed with patient, son, and daughter in law at bedside about treatment options.  Revision of knee replacement is not a good option with patient's physiologic reserve and medical condition.  She also has nonhealing ulcers which would increase risk of infection of knee revision.  The two truly feasible options are nonop treatment with KI forever which would leave her with a chronic unstable painful essentially nonfunctional leg that she cannot bear weight through.  Because she will likely never walk any significant distances ever again and mainly be a wheelchair ambulator, an AKA in my opinion offers the best quality of life for her if she is able to recover from the surgery.  The family will think about our discussion and I have left my contact info so that they can contact me if they decide to proceed with an AKA.  Otherwise I will see them as needed in my office.  Cindy Cecil, MD [name] 10:43 AM

## 2016-06-26 NOTE — Progress Notes (Signed)
Patient had episode of 25 beat run of v-tach and also HR in the 30's with 2.84 sec. Pause. Patient was awake and asymptomatic denies any chest pain or discomfort except for the rt. Leg fx. K. KirbyNP was notified with orders made. Will follow up blood work.

## 2016-06-26 NOTE — Progress Notes (Signed)
Patient Demographics:    Cindy Mills, is a 83 y.o. female, DOB - [birth year], [id number]  Admit date - 06/22/2016   Admitting Physician Norval Morton, MD  Outpatient Primary MD for the patient is No primary care provider on file.  LOS - 4   Chief Complaint  Patient presents with  . 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 Family who is present at bedside provide history as patient is currently confused. Patient was previously being cared for at [facility] assisted living facility. Patient had fallen at Thanksgiving and fractured her right leg. During the hospitalization at [facility] she was also found to have DVT for which it appears that the patient was not placed on any anticoagulation. She was evaluated by orthopedic surgery,but was unable to have surgical correction due to issues with the rightknee. Following the hospitalization she was transferred back to [facility] to the nursing home part, andwas placed on pain medications tramadol and fentanyl. Family notes that the facility  continue to increase dosage of these medications and as a result the patient had declining function, mental status, and issues with dehydration. They requested multiple times for pain medications to bedecreased. Family after evaluating overall care decided to transfer her from [facility] to [place] home. Family notes that there was found to fentanyl patch discontinued but she had been increased from tramadol 25 mg 100 mg for pain. However, [place] [facility] [place] [facility] was found to besemi-conscious and difficult to wake. EMS was called prior to her being checked in to the nursing home. En route they had to administered Narcan with someimprovement of patient mental status. Son who is also patient's POA changed theDO NOT RESUSCITATE status and wants her to be a full code.   1) opiate-induced Acute encephalopathy and respiratory depression- resolved, be very judicious with opiates   2)MRSA and Pseudomonas infection of the right leg - patient has Right leg ulcerations/pressure ulceration, currently on vancomycin and Azactam, upon discharge may use Cipro for Pseudomonas and oral Zyvox or oral clindamycin for MRSA   3)Right knee/femur fracture/ wound positive Pseudomonas/MRSA-  orthopedic consult appreciated, at this time patient and family of declined AKA offered as an option by orthopedic surgeon, we will like to go back to rehabilitation center [facility]  she'll be wheelchair bound with her surgery at this time.    4)CHF- diastolic dysfunction CHF with EF over 70% (as per family per  [facility] records)- compensated, continue Lasix, Coreg and amlodipine   5)DVT Right lower extremity:DVT in the right lower extremity but was never placed on any anticoagulation,  Question reasoning for patient not being placed on anticoagulation. Secondary to surgery? Currently on prophylactic Lovenox. Risk-benefit of full anti-coagulation discussed with  patient and family, decision to be made prior to discharge  6)Essential hypertension: Continue amlodipine, and Lasix, clonidine patch and lisinopril currently on hold due to soft blood pressures   7)Anemia: -Overall stable anemia at this time, baseline hemoglobin is 11, hemoglobin is currently 9.5  8)Pain control- -Would use Tylenol first, then tramadol. Be very judicious in use of pain medication per patient's chart very sensitive to medication.   Code Status : POA exchange patient's course status to full code   Disposition Plan  : to [facility] on 06/27/16  Consults  :  Orthopedics   DVT Prophylaxis  :  Lovenox   Lab Results  Component Value Date   PLT 352 06/26/2016    Inpatient Medications  Scheduled Meds: . amLODipine  10 mg Oral Daily  . aspirin  325 mg Oral Daily  . aztreonam  2 g Intravenous Q8H  . budesonide  0.25 mg Nebulization BID  . carvedilol  3.125 mg Oral BID WC  . collagenase   Topical Daily  . enoxaparin (LOVENOX) injection  40 mg Subcutaneous Q24H  . feeding supplement (PRO-STAT SUGAR FREE 64)  30 mL Oral TID WC  . furosemide  20 mg Oral Daily  . magnesium sulfate 1 - 4 g bolus IVPB  2 g Intravenous Once  . oxybutynin  5 mg Oral Daily  . pantoprazole  40 mg Oral Daily  . sodium chloride flush  3 mL Intravenous Q12H  . vancomycin  750 mg Intravenous Q12H   Continuous Infusions: PRN Meds:.acetaminophen **OR** acetaminophen, albuterol, hydrALAZINE, metoprolol, ondansetron **OR** ondansetron (ZOFRAN) IV, traMADol    Anti-infectives    Start     Dose/Rate Route Frequency Ordered Stop   06/26/16 0515  vancomycin (VANCOCIN) IVPB 750 mg/150 ml premix     750 mg 150 mL/hr over 60 Minutes Intravenous Every 12 hours 06/25/16 1708     06/25/16 1715  vancomycin (VANCOCIN) 1,500 mg in sodium chloride 0.9 % 500 mL IVPB     1,500 mg 250 mL/hr over 120 Minutes Intravenous  Once 06/25/16 1708 06/25/16 1929   06/25/16 1400  aztreonam (AZACTAM) 2 g in dextrose 5 %  50 mL IVPB     2 g 100 mL/hr over 30 Minutes Intravenous Every 8 hours 06/25/16 1357     06/22/16 1845  aztreonam (AZACTAM) 1 g in dextrose 5 % 50 mL IVPB  Status:  Discontinued     1 g 100 mL/hr over 30 Minutes Intravenous Every 8 hours 06/22/16 1842 06/22/16 1844   06/22/16 1845  aztreonam (AZACTAM) 500 mg in dextrose 5 % 50 mL IVPB  Status:  Discontinued     500 mg 100 mL/hr over 30 Minutes Intravenous Every 8 hours 06/22/16 1844 06/25/16 1357        Objective:   Vitals:   06/26/16 1106 06/26/16 1237 06/26/16 1640 06/26/16 1816  BP: 133/61 (!) 116/53 (!) 113/58   Pulse: 62 63 64 65  Resp:      Temp:  98.1 F (36.7 C) 98.2 F (36.8  C)   TempSrc:  Oral Oral   SpO2:  94% 94%   Weight:      Height:        Wt Readings from Last 3 Encounters:  06/26/16 77 kg (169 lb 12.1 oz)     Intake/Output Summary (Last 24 hours) at 06/26/16 1828 Last data filed at 06/26/16 1221  Gross per 24 hour  Intake              853 ml  Output                0 ml  Net              853 ml     Physical Exam  Gen:- Awake Alert,  In no acute distress HEENT:- [place].AT, No sclera icterus Neck-Supple Neck,No JVD,.  Lungs-  CTAB  CV- S1, S2 normal Abd-  +ve B.Sounds, Abd Soft, No tenderness,    Extremity/Skin: Right lower extremity with open wounds, he'll area with necrotic pressure wounds with some drainage    Data Review:   Micro Results Recent Results (from the past 240 hour(s))  Culture, blood (routine x 2)     Status: Abnormal   Collection Time: 06/22/16  5:45 PM  Result Value Ref Range Status   Specimen Description BLOOD LEFT ANTECUBITAL  Final   Special Requests BOTTLES DRAWN AEROBIC AND ANAEROBIC 5CC  Final   Culture  Setup Time   Final    GRAM VARIABLE ROD ANAEROBIC BOTTLE ONLY CRITICAL RESULT CALLED TO, READ BACK BY AND VERIFIED WITH: PHARMD M.MACCIA [id number] MLM    Culture (A)  Final    BACILLUS SPECIES Standardized susceptibility testing for this organism is not  available.    Report Status 06/25/2016 FINAL  Final  Blood Culture ID Panel (Reflexed)     Status: None   Collection Time: 06/22/16  5:45 PM  Result Value Ref Range Status   Enterococcus species NOT DETECTED NOT DETECTED Final   Listeria monocytogenes NOT DETECTED NOT DETECTED Final   Staphylococcus species NOT DETECTED NOT DETECTED Final   Staphylococcus aureus NOT DETECTED NOT DETECTED Final   Streptococcus species NOT DETECTED NOT DETECTED Final   Streptococcus agalactiae NOT DETECTED NOT DETECTED Final   Streptococcus pneumoniae NOT DETECTED NOT DETECTED Final   Streptococcus pyogenes NOT DETECTED NOT DETECTED Final   Acinetobacter baumannii NOT DETECTED NOT DETECTED Final   Enterobacteriaceae species NOT DETECTED NOT DETECTED Final   Enterobacter cloacae complex NOT DETECTED NOT DETECTED Final   Escherichia coli NOT DETECTED NOT DETECTED Final   Klebsiella oxytoca NOT DETECTED NOT DETECTED Final   Klebsiella pneumoniae NOT DETECTED NOT DETECTED Final   Proteus species NOT DETECTED NOT DETECTED Final   Serratia marcescens NOT DETECTED NOT DETECTED Final   Haemophilus influenzae NOT DETECTED NOT DETECTED Final   Neisseria meningitidis NOT DETECTED NOT DETECTED Final   Pseudomonas aeruginosa NOT DETECTED NOT DETECTED Final   Candida albicans NOT DETECTED NOT DETECTED Final   Candida glabrata NOT DETECTED NOT DETECTED Final   Candida krusei NOT DETECTED NOT DETECTED Final   Candida parapsilosis NOT DETECTED NOT DETECTED Final   Candida tropicalis NOT DETECTED NOT DETECTED Final  Wound or Superficial Culture     Status: None   Collection Time: 06/22/16  6:49 PM  Result Value Ref Range Status   Specimen Description LEG RIGHT  Final   Special Requests NONE  Final   Gram Stain   Final    FEW WBC  PRESENT, PREDOMINANTLY PMN FEW GRAM POSITIVE COCCI IN PAIRS    Culture   Final    FEW PSEUDOMONAS AERUGINOSA ABUNDANT METHICILLIN RESISTANT STAPHYLOCOCCUS AUREUS    Report Status  06/25/2016 FINAL  Final   Organism ID, Bacteria PSEUDOMONAS AERUGINOSA  Final   Organism ID, Bacteria METHICILLIN RESISTANT STAPHYLOCOCCUS AUREUS  Final      Susceptibility   Methicillin resistant staphylococcus aureus - MIC*    CIPROFLOXACIN >=8 RESISTANT Resistant     ERYTHROMYCIN >=8 RESISTANT Resistant     GENTAMICIN <=0.5 SENSITIVE Sensitive     OXACILLIN >=4 RESISTANT Resistant     TETRACYCLINE <=1 SENSITIVE Sensitive     VANCOMYCIN <=0.5 SENSITIVE Sensitive     TRIMETH/SULFA >=320 RESISTANT Resistant     CLINDAMYCIN <=0.25 SENSITIVE Sensitive     RIFAMPIN <=0.5 SENSITIVE Sensitive     Inducible Clindamycin NEGATIVE Sensitive     * ABUNDANT METHICILLIN RESISTANT STAPHYLOCOCCUS AUREUS   Pseudomonas aeruginosa - MIC*    CEFTAZIDIME 4 SENSITIVE Sensitive     CIPROFLOXACIN <=0.25 SENSITIVE Sensitive     GENTAMICIN <=1 SENSITIVE Sensitive     IMIPENEM <=0.25 SENSITIVE Sensitive     PIP/TAZO 8 SENSITIVE Sensitive     CEFEPIME 2 SENSITIVE Sensitive     * FEW PSEUDOMONAS AERUGINOSA  MRSA PCR Screening     Status: None   Collection Time: 06/22/16  8:52 PM  Result Value Ref Range Status   MRSA by PCR NEGATIVE NEGATIVE Final    Comment:        The GeneXpert MRSA Assay (FDA approved for NASAL specimens only), is one component of a comprehensive MRSA colonization surveillance program. It is not intended to diagnose MRSA infection nor to guide or monitor treatment for MRSA infections.   Culture, blood (routine x 2)     Status: None (Preliminary result)   Collection Time: 06/22/16  9:13 PM  Result Value Ref Range Status   Specimen Description BLOOD RIGHT ANTECUBITAL  Final   Special Requests IN PEDIATRIC BOTTLE 1.5CC  Final   Culture NO GROWTH 3 DAYS  Final   Report Status PENDING  Incomplete    Radiology Reports Dg Chest 2 View  Result Date: 06/22/2016 CLINICAL DATA:  History of congestive heart failure and hypertension. Generalized right lower leg pain and heel pain. EXAM:  CHEST  2 VIEW COMPARISON:  None. FINDINGS: The aorta is tortuous. The heart size is enlarged. There is a 6 mm nodule in the right upper lobe. There is no focal pneumonia, pulmonary edema, or pleural effusion. There is kyphosis of spine. Degenerative joint changes of bilateral shoulders and the spine are identified. IMPRESSION: Cardiomegaly.  No focal abnormality identified within the lungs. 6 mm nodule in the right upper lobe. Further evaluation with a chest CT on outpatient basis is recommended. Electronically Signed   By: Abelardo Diesel M.D.   On: 06/22/2016 17:18   Dg Tibia/fibula Right  Result Date: 06/22/2016 CLINICAL DATA:  Right leg and heel pain. EXAM: RIGHT TIBIA AND FIBULA - 2 VIEW COMPARISON:  None. FINDINGS: There is extensive comminuted displaced fracture of the distal femur. There is displaced fracture of the proximal fibula. There is probable fracture/ knee replacement loosening of the proximal anterior aspect of the proximal tibia. There is no bony destruction to suggest osteomyelitis. There is anterior ulceration of the lower right anterior leg. IMPRESSION: Fractures of the distal femur, proximal fibula and probable/ knee replacement loosening of the proximal anterior aspect of the proximal tibia. Anterior ulceration  of the right lower leg. No evidence of osteomyelitis. Electronically Signed   By: Abelardo Diesel M.D.   On: 06/22/2016 17:25   Dg Foot Complete Right  Result Date: 06/22/2016 CLINICAL DATA:  Ulcers on the right leg and right heel. EXAM: RIGHT FOOT COMPLETE - 3+ VIEW COMPARISON:  None. FINDINGS: There is curvilinear lucency at the base of the third and fourth metatarsals suspicious for fracture. There is cortical discontinuity in the distal aspect of the first metatarsal suspicious for fracture. There is no bony destruction to suggest osteomyelitis. There is diffuse osteopenia. Degenerative joint changes throughout the right foot are noted. IMPRESSION: No evidence of osteomyelitis.  Lucency at the base of the third and fourth metatarsals and in the distal aspect of the first metatarsal suspicious for fracture. Electronically Signed   By: Abelardo Diesel M.D.   On: 06/22/2016 17:21   Dg Femur, Min 2 Views Right  Result Date: 06/24/2016 CLINICAL DATA:  Right distal femur fracture EXAM: RIGHT FEMUR 2 VIEWS COMPARISON:  06/22/2016 right tibia/fibula radiographs. FINDINGS: There is a comminuted periprosthetic fracture in the distal right femoral metaphysis, with 4 cm posterior displacement of the dominant distal fracture fragment. There is an intramedullary rod in the right femoral shaft with interlocking proximal right femoral neck pin and 2 distal interlocking screws, with healed deformity in the mid right femoral shaft in no evidence of hardware fracture or loosening. There is a right total knee arthroplasty with no dislocation at the right distal femoral and right proximal tibial prosthetic articulation. There is a large enthesophyte at the inferior right patella. No dislocation at the right hip joint. Diffuse osteopenia. Prominent vascular calcifications throughout the soft tissues. No suspicious focal osseous lesions. IMPRESSION: Comminuted periprosthetic fracture in the distal right femoral metaphysis with displacement as described. Postsurgical changes from right total knee arthroplasty and prior ORIF in the right femoral shaft with no evidence of hardware fracture or loosening. Electronically Signed   By: Ilona Sorrel M.D.   On: 06/24/2016 10:05   Dg Knee 2 Views Right  Result Date: 06/24/2016 CLINICAL DATA:  Right distal femur fracture EXAM: RIGHT KNEE - 3 VIEW COMPARISON:  06/22/2016 right tibia/ fibula radiographs. 06/24/2016 right femur radiographs. FINDINGS: Status post right total knee arthroplasty. No dislocation at the articulation of the right distal femoral and right proximal tibial arthroplasty prosthetic components. Partially visualized intramedullary rod with 2 distal  interlocking screws in the right distal femoral shaft. Comminuted periprosthetic fracture in the right distal femoral metaphysis with approximately 4 cm posterior displacement of the dominant distal fracture fragment. Diffuse osteopenia. No suspicious focal osseous lesions. Large enthesophyte at the inferior right patella. Vascular calcifications throughout the posterior soft tissues. IMPRESSION: Comminuted periprosthetic fracture in the right distal femoral metaphysis with displacement as described. Postsurgical changes from right total knee arthroplasty and prior ORIF in the right femoral shaft. Diffuse osteopenia. Electronically Signed   By: Ilona Sorrel M.D.   On: 06/24/2016 10:07     CBC  Recent Labs Lab 06/22/16 1620  06/23/16 0027 06/23/16 0929 06/24/16 0105 06/25/16 0310 06/26/16 0127  WBC 22.4*  --  19.7* 17.1* 16.0* 17.1* 17.4*  HGB 10.4*  < > 9.3* 9.6* 9.5* 9.7* 9.3*  HCT 34.0*  < > 29.6* 30.5* 30.2* 31.4* 29.6*  PLT 358  --  338 310 333 360 352  MCV 90.2  --  89.4 88.2 88.8 88.2 87.6  MCH 27.6  --  28.1 27.7 27.9 27.2 27.5  MCHC 30.6  --  31.4 31.5 31.5 30.9 31.4  RDW 14.7  --  14.7 14.7 14.8 14.5 14.5  LYMPHSABS 1.5  --   --   --   --   --   --   MONOABS 2.4*  --   --   --   --   --   --   EOSABS 0.3  --   --   --   --   --   --   BASOSABS 0.1  --   --   --   --   --   --   < > = values in this interval not displayed.  Chemistries   Recent Labs Lab 06/22/16 1620  06/23/16 0027 06/23/16 0929 06/24/16 0105 06/25/16 0310 06/26/16 0127  NA 135  < > 134* 134* 139 139 137  K >7.5*  < > 5.5*  5.6* 5.9* 4.7 3.7 3.8  CL 111  < > 111 114* 117* 114* 112*  CO2 17*  --  15* 16* 16* 18* 18*  GLUCOSE 108*  < > 156* 117* 119* 109* 136*  BUN 55*  < > 52* 44* 28* 18 24*  CREATININE 1.59*  < > 1.36* 1.00 0.81 0.74 0.68  CALCIUM 8.4*  --  8.0* 7.9* 8.1* 8.0* 7.6*  MG  --   --   --   --  1.7 1.6* 1.4*  AST 24  --   --   --   --   --   --   ALT 16  --   --   --   --   --   --    ALKPHOS 122  --   --   --   --   --   --   BILITOT 0.6  --   --   --   --   --   --   < > = values in this interval not displayed. ------------------------------------------------------------------------------------------------------------------ No results for input(s): CHOL, HDL, LDLCALC, TRIG, CHOLHDL, LDLDIRECT in the last 72 hours.  No results found for: HGBA1C ------------------------------------------------------------------------------------------------------------------ No results for input(s): TSH, T4TOTAL, T3FREE, THYROIDAB in the last 72 hours.  Invalid input(s): FREET3 ------------------------------------------------------------------------------------------------------------------ No results for input(s): VITAMINB12, FOLATE, FERRITIN, TIBC, IRON, RETICCTPCT in the last 72 hours.  Coagulation profile  Recent Labs Lab 06/22/16 2156  INR 1.22    No results for input(s): DDIMER in the last 72 hours.  Cardiac Enzymes No results for input(s): CKMB, TROPONINI, MYOGLOBIN in the last 168 hours.  Invalid input(s): CK ------------------------------------------------------------------------------------------------------------------ No results found for: BNP   Evens Meno M.D on 06/26/2016 at 6:28 PM  Between 7am to 7pm - Pager - [phone]  After 7pm go to [email or web address] - password [email or web address]  [facility] -  Office  [phone]  Dragon dictation system was used to create this note, attempts have been made to correct errors, however presence of uncorrected errors is not a reflection quality of care provided

## 2016-06-26 NOTE — Progress Notes (Signed)
Per Tammy, Infection Prevention/Control nurse, the patient has tested positive for MRSA via right leg wound. Contact precautions initiated.

## 2016-06-26 NOTE — Progress Notes (Signed)
BMET and mag level resulted, K. KirbyNP was notified with orders made. 12 lead EKg done  ? afib in the monitor. Magnesium 2grams IV given. EKG shows junctional rhythm and SR with heart block. Will continue to monitor patient.

## 2016-06-27 DIAGNOSIS — I82409 Acute embolism and thrombosis of unspecified deep veins of unspecified lower extremity: Secondary | ICD-10-CM | POA: Diagnosis present

## 2016-06-27 LAB — CBC
HEMATOCRIT: 32.7 % — AB (ref 36.0–46.0)
HEMOGLOBIN: 10.2 g/dL — AB (ref 12.0–15.0)
MCH: 27.5 pg (ref 26.0–34.0)
MCHC: 31.2 g/dL (ref 30.0–36.0)
MCV: 88.1 fL (ref 78.0–100.0)
Platelets: 343 10*3/uL (ref 150–400)
RBC: 3.71 MIL/uL — ABNORMAL LOW (ref 3.87–5.11)
RDW: 14.8 % (ref 11.5–15.5)
WBC: 16.2 10*3/uL — ABNORMAL HIGH (ref 4.0–10.5)

## 2016-06-27 LAB — BASIC METABOLIC PANEL
Anion gap: 9 (ref 5–15)
BUN: 26 mg/dL — AB (ref 6–20)
CALCIUM: 8 mg/dL — AB (ref 8.9–10.3)
CHLORIDE: 112 mmol/L — AB (ref 101–111)
CO2: 17 mmol/L — AB (ref 22–32)
CREATININE: 0.75 mg/dL (ref 0.44–1.00)
GFR calc Af Amer: >60 mL/min (ref >60)
GFR calc non Af Amer: >60 mL/min (ref >60)
GLUCOSE: 121 mg/dL — AB (ref 65–99)
Potassium: 4.2 mmol/L (ref 3.5–5.1)
Sodium: 138 mmol/L (ref 135–145)

## 2016-06-27 MED ORDER — CIPROFLOXACIN HCL 500 MG PO TABS: 500.0000 mg | ORAL_TABLET | Freq: Two times a day (BID) | ORAL | 0 refills | Status: AC

## 2016-06-27 MED ORDER — ONDANSETRON HCL 4 MG PO TABS: 4.0000 mg | ORAL_TABLET | Freq: Four times a day (QID) | ORAL | 0 refills | Status: AC | PRN

## 2016-06-27 MED ORDER — ACETAMINOPHEN 325 MG PO TABS: 650.0000 mg | ORAL_TABLET | Freq: Four times a day (QID) | ORAL | 0 refills | Status: AC | PRN

## 2016-06-27 MED ORDER — APIXABAN 5 MG PO TABS: 5.0000 mg | ORAL_TABLET | Freq: Two times a day (BID) | ORAL | 1 refills | Status: AC

## 2016-06-27 MED ORDER — DOXYCYCLINE HYCLATE 100 MG PO TABS: 100.0000 mg | ORAL_TABLET | Freq: Two times a day (BID) | ORAL | 0 refills | Status: AC

## 2016-06-27 MED ORDER — ALBUTEROL SULFATE (2.5 MG/3ML) 0.083% IN NEBU: 2.5000 mg | INHALATION_SOLUTION | RESPIRATORY_TRACT | 12 refills | Status: AC | PRN

## 2016-06-27 MED ORDER — MEGESTROL ACETATE 400 MG/10ML PO SUSP: 400.0000 mg | Freq: Every day | ORAL | 0 refills | Status: AC

## 2016-06-27 MED ORDER — AMLODIPINE BESYLATE 10 MG PO TABS: 10.0000 mg | ORAL_TABLET | Freq: Every day | ORAL | 0 refills | Status: AC

## 2016-06-27 MED ORDER — CARVEDILOL 3.125 MG PO TABS: 3.1250 mg | ORAL_TABLET | Freq: Two times a day (BID) | ORAL | 1 refills | Status: AC

## 2016-06-27 NOTE — Clinical Social Work Placement (Signed)
   CLINICAL SOCIAL WORK PLACEMENT  NOTE  Date:  06/27/2016  Patient Details  Name: Cindy Mills MRN: [id number] Date of Birth: 03-11-1918  Clinical Social Work is seeking post-discharge placement for this patient at the [place] level of care (*CSW will initial, date and re-position this form in  chart as items are completed):  Yes   Patient/family provided with [facility] Work Department's list of facilities offering this level of care within the geographic area requested by the patient (or if unable, by the patient's family).  Yes   Patient/family informed of their freedom to choose among providers that offer the needed level of care, that participate in Medicare, Medicaid or managed care program needed by the patient, have an available bed and are willing to accept the patient.  Yes   Patient/family informed of [facility]'s ownership interest in [facility] and [facility], as well as of the fact that they are under no obligation to receive care at these facilities.  PASRR submitted to EDS on 06/23/16     PASRR number received on 06/23/16     Existing PASRR number confirmed on       FL2 transmitted to all facilities in geographic area requested by pt/family on 06/23/16     FL2 transmitted to all facilities within larger geographic area on       Patient informed that his/her managed care company has contracts with or will negotiate with certain facilities, including the following:        Yes   Patient/family informed of bed offers received.  Patient chooses bed at [place] [place]     Physician recommends and patient chooses bed at      Patient to be transferred to [place], [place] on 06/27/16.  Patient to be transferred to facility by ambulance     Patient family notified on 06/27/16 of transfer.  Name of family member notified:  Elenore Rota and [place] Please sign FL2, Please prepare priority discharge  summary, including medications, Please prepare prescriptions     Additional Comment:  RN will call for transport.  _______________________________________________ Rigoberto Noel, LCSW 06/27/2016, 11:24 AM

## 2016-06-27 NOTE — Discharge Summary (Addendum)
Cindy Mills, is a 83 y.o. female  DOB March 07, 1918  MRN [id number].  Admission date:  06/22/2016  Admitting Physician  Norval Morton, MD  Discharge Date:  06/27/2016   Primary MD  No primary care provider on file.  Recommendations for primary care physician for things to follow:   Follow up with orthopedic surgeon as advised as outpatient  CBC and BMP every Mondays and Fridays while on antibiotics to be faxed to PCP   Admission Diagnosis  Hyperkalemia [E87.5] Acute cystitis without hematuria [N30.00]   Discharge Diagnosis  Hyperkalemia [E87.5] Acute cystitis without hematuria [N30.00]    Principal Problem:   Hyperkalemia Active Problems:   Pressure injury of skin   AKI (acute kidney injury) ([place])   Acute encephalopathy   Leukocytosis   History of CHF (congestive heart failure)   Anemia   Acute cystitis without hematuria   Acute renal failure (HCC)   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 Breast cancer ([place])   . Cancer ([place])   . CHF (congestive heart failure) ([place])   . Hypertension     Past Surgical History:  Procedure Laterality Date  . BREAST LUMPECTOMY    . REPLACEMENT TOTAL KNEE BILATERAL         HPI  from the history and physical done on the day of admission:    Chief Complaint: altered mental status  HPI: Cindy Mills is a 83 y.o. female with medical history significant of HTN, CHF, breast cancer, and dvt; who presented with altered mental status. Family who is present at bedside provide history as patient is currently confused. Patient was previously being cared for at [facility] assisted living facility. Patient had fallen at Thanksgiving and fractured her right leg. During the  hospitalization at [facility] she was also found to have DVT for which it appears that the patient was not placed on any anticoagulation. She was evaluated by orthopedic surgery, but was unable to have surgical correction due to issues with the right knee. Following the hospitalization she was transferred back to [facility] to the nursing home part, and was placed on pain medications tramadol and fentanyl. Family notes that the facility continue to increase dosage of these medications and as a result the patient had declining function, mental status, and issues with dehydration. They requested multiple times for pain medications to be decreased. Family after evaluating overall care decided to transfer her from [facility] to [place] home. Family notes that there was found to fentanyl patch discontinued but she had been increased from tramadol 25 mg 100 mg for pain. However, after being transferred to [place] home today patient was found to be semi-conscious and difficult to wake.  EMS was called prior to her being checked in  to the nursing home. En route they had to administered Narcan with some improvement of patient mental status. Son who is also patient's POA  changed the DO NOT RESUSCITATE status and wants her to be a full code.  ED Course: Upon admission the emergency room patient was seen to be afebrile with all other vital signs relatively within normal limits. Laboratory revealed WBC 22.4, hemoglobin 10.4, potassium>7.5, BUN 55, creatinine 1.59, lactic acid 1.37, and all other lab values relatively within normal limits. UA was positive for  signs of infection. Patient was given 1 L of NS IV fluids and antibiotics of aztreonam for suspected UTI. TRH called to admit to stepdown     Hospital Course:    Interval history: 83 y.o.WF PMHx HTN, CHF, Breast cancer, Rt Leg DVT not on anticoagulant;   Who presented with altered mental status. Family who is  present at bedside provide history as patient is currently confused. Patient was previously being cared for at [facility] assisted living facility. Patient had fallen at Thanksgiving andfractured her right leg.During the hospitalization at [facility] was also found to have DVT for which it appears that the patient was not placed on any anticoagulation. She was evaluated by orthopedic surgery,but was unable to have surgical correction due to issues with the rightknee. Following the hospitalization she was transferred back to [facility] to the nursing home part, andwas placed on pain medications tramadol and fentanyl. Family notes that the facility continue to increase dosage of these medications and as a result the patient had declining function, mental status, and issues with dehydration. They requested multiple times for pain medications to bedecreased. [facility] after evaluating [facility] [place] [facility] Family notes that there was found to fentanyl patch discontinued but she had been increased from tramadol 25 mg 100 mg for pain. However, after being transferred to [place] [facility] patient was found to besemi-conscious and difficult to wake. EMS was called prior to her being checked in to the nursing home. En route they had to administered Narcan with someimprovement of patient mental status. Son who is also patient's POA changed theDO NOT RESUSCITATE status and wants her to be a full code.   1)Opiate-induced Acute encephalopathy and respiratory depression- resolved, be very judicious with opiates, at this time patient's pain appears to be adequately controlled on Tylenol   2)MRSA and Pseudomonas infection of the right leg - patient has Right leg ulcerations/pressure ulceration, acute and with IV vancomycin and Azactam, upon discharge may use Cipro for Pseudomonas and oral doxycycline for MRSA  as per the wound culture results and sensitivities   3)Right knee/femur fracture/woundpositivePseudomonas/MRSA-  orthopedic consult appreciated, at this time patient and family of declined AKA offered as an option by orthopedic surgeon, we will like to go back to rehabilitation center [facility] she'll be wheelchair bound with her surgery at this time. Please see #2 above   4)CHF- diastolic dysfunction CHF with EF over 70% [facility] compensated, continue Lasix, Coreg and amlodipine   5)DVT Right lower extremity:DVT in the right lower extremity (right popliteal vein and right gastrocnemius vein) - please see venous Dopplers from 06/24/2016. Possible treatment for acute DVT of the right lower extremity discussed with patient and her son (POA), given the fact that patient and family have declined surgery at this time, no pending surgical interventions at this time, son and patient would like  to go ahead with anticoagulation, risk benefit of anticoagulation discussed, questions answered, Eliquis will be started at 5 mg twice a day (due to advanced age would not do 10 mg twice a day for 1 week initially), patient get CBC Mondays and Fridays for the next couple of weeks to look for any bleeding  6)Essential hypertension: Continue amlodipine, and Lasix, clonidine patch and lisinopril currently on hold due to soft blood pressures  7)Anemia: -Overall stable anemia at this time, baseline hemoglobin is 11, hemoglobin is currently 9.5, CBCs Mondays and Fridays while on Eliquis for the next couple weeks  8)Pain control- -Would use Tylenol first, then tramadol. Be very judicious in use of pain medication per patient's chart very sensitive to medication.   Code Status : POA has Changed patient's CODE status to full code   Disposition Plan  : to [facility] on 06/27/16  Consults  :  Orthopedics  Discharge Condition: Stable  Follow UP 1)Wound Care  consult at [place] rehabilitation facility due to extensive right lower extremity wound that may require debridement 2)CBC and BMP every Monday and Friday starting 06/29/2016- Please Fax results to PCP 3)Watch for stool color and other signs of bleeding while on Eliquis for Rt Leg DVT ( right popliteal vein and right gastrocnemius vein) 4) nonweightbearing status on right lower extremity 5)F/u with orthopedic surgeon as outpatient to discuss further management of right knee fracture 6)Watch for Diarrhea as patient is at increased risk for C. difficile while on antibiotics including Cipro for Pseudomonas and doxycycline for MRSA from right leg wounds     Contact information for follow-up providers    Eduard Roux, MD Follow up.   Specialty:  Orthopedic Surgery Why:  as needed Contact information: [place] [id number] [phone]            Contact information for after-discharge care    Destination    [facility] .   Specialty:  Skilled Nursing Facility Contact information: [place] [phone]                   Consults obtained - Orthopedics  Diet and Activity recommendation:  As advised.. Nonweightbearing on right lower extremity  Discharge Instructions    Discharge Instructions    Call MD for:  difficulty breathing, headache or visual disturbances    Complete by:  As directed    Call MD for:  hives    Complete by:  As directed    Call MD for:  persistant nausea and vomiting    Complete by:  As directed    Call MD for:  redness, tenderness, or signs of infection (pain, swelling, redness, odor or green/yellow discharge around incision site)    Complete by:  As directed    Call MD for:  severe uncontrolled pain    Complete by:  As directed    Call MD for:  temperature >100.4    Complete by:  As directed    Diet - low sodium heart healthy    Complete by:  As  directed    Discharge instructions    Complete by:  As directed    1)Wound Care consult at [place] rehabilitation facility due to extensive right lower extremity wound that may require debridement 2)CBC and BMP every Monday and Friday starting 06/29/2016- Please Fax results to PCP 3)Watch for stool color and other signs of bleeding while on Eliquis for Rt Leg DVT ( right  popliteal vein and right gastrocnemius vein) 4) nonweightbearing status on right lower extremity 5)F/u with orthopedic surgeon as outpatient to discuss further management of right knee fracture 6)Watch for Diarrhea as patient is at increased risk for C. difficile while on antibiotics including Cipro for Pseudomonas and doxycycline for MRSA from right leg wounds   Discharge wound care:    Complete by:  As directed    Santyl ointment to right anterior leg wound daily Wet to try dressings to necrotic wounds of the right leg posteriorly and the heel daily Wound care consult as patient may need further debridement of this necrotic wounds on the back of her leg and heel   Increase activity slowly    Complete by:  As directed    Nonweightbearing status on right lower extremity        Discharge Medications     Allergies as of 06/27/2016      Reactions   Penicillins Anaphylaxis   Family does not have any additional information   Amoxicillin Other (See Comments)   Unknown reaction per MAR   Sulfa Antibiotics Other (See Comments)      Medication List    STOP taking these medications   aspirin 325 MG tablet   docusate sodium 100 MG capsule Commonly known as:  COLACE   potassium chloride 20 MEQ packet Commonly known as:  KLOR-CON   traMADol 50 MG tablet Commonly known as:  ULTRAM     TAKE these medications   acetaminophen 325 MG tablet Commonly known as:  TYLENOL Take 2 tablets (650 mg total) by mouth every 6 (six) hours as needed for mild pain (or Fever >/= 101).   albuterol (2.5 MG/3ML) 0.083% nebulizer  solution Commonly known as:  PROVENTIL Take 3 mLs (2.5 mg total) by nebulization every 4 (four) hours as needed for wheezing.   alendronate 70 MG tablet Commonly known as:  FOSAMAX Take 70 mg by mouth every Sunday. Take with a full glass of water on an empty stomach.   amLODipine 10 MG tablet Commonly known as:  NORVASC Take 1 tablet (10 mg total) by mouth daily.   apixaban 5 MG Tabs tablet Commonly known as:  ELIQUIS Take 1 tablet (5 mg total) by mouth 2 (two) times daily. Rt Leg DVT   carvedilol 3.125 MG tablet Commonly known as:  COREG Take 1 tablet (3.125 mg total) by mouth 2 (two) times daily with a meal.   cholecalciferol 1000 units tablet Commonly known as:  VITAMIN D Take 1,000 Units by mouth daily.   ciprofloxacin 500 MG tablet Commonly known as:  CIPRO Take 1 tablet (500 mg total) by mouth 2 (two) times daily.   cloNIDine 0.2 mg/24hr patch Commonly known as:  CATAPRES - Dosed in mg/24 hr Place 0.2 mg onto the skin every Thursday.   doxycycline 100 MG tablet Commonly known as:  VIBRA-TABS Take 1 tablet (100 mg total) by mouth 2 (two) times daily.   feeding supplement (PRO-STAT SUGAR FREE 64) Liqd Take 30 mLs by mouth 3 (three) times daily with meals.   fluticasone 50 MCG/BLIST diskus inhaler Commonly known as:  FLOVENT DISKUS Inhale 2 puffs into the lungs every 12 (twelve) hours. Rinse mouth with water after use. Do not swallow.   furosemide 20 MG tablet Commonly known as:  LASIX Take 20 mg by mouth daily.   guaiFENesin 600 MG 12 hr tablet Commonly known as:  MUCINEX Take 600 mg by mouth every 12 (twelve) hours.   lisinopril  10 MG tablet Commonly known as:  PRINIVIL,ZESTRIL Take 10 mg by mouth See admin instructions. Take 1 tablet (10 mg) by mouth every 12 hours - hold for SBP <110   megestrol 400 MG/10ML suspension Commonly known as:  MEGACE Take 10 mLs (400 mg total) by mouth daily.   multivitamin with minerals Tabs tablet Take 1 tablet by mouth  daily.   omeprazole 20 MG capsule Commonly known as:  PRILOSEC Take 20 mg by mouth daily at 6 (six) AM.   ondansetron 4 MG disintegrating tablet Commonly known as:  ZOFRAN-ODT Take 4 mg by mouth every 4 (four) hours as needed for nausea or vomiting.   ondansetron 4 MG tablet Commonly known as:  ZOFRAN Take 1 tablet (4 mg total) by mouth every 6 (six) hours as needed for nausea.   oxybutynin 5 MG 24 hr tablet Commonly known as:  DITROPAN-XL Take 5 mg by mouth daily.   PROBIOTIC PO Take 1 capsule by mouth every 12 (twelve) hours. Probiotic Formula Veggie Caps 1B-250 mg   promethazine 25 MG suppository Commonly known as:  PHENERGAN Place 25 mg rectally every 4 (four) hours as needed for nausea or vomiting.   vitamin C 500 MG tablet Commonly known as:  ASCORBIC ACID Take 500 mg by mouth every 12 (twelve) hours.       Major procedures and Radiology Reports - PLEASE review detailed and final reports for all details, in brief -    Dg Chest 2 View  Result Date: 06/22/2016 CLINICAL DATA:  History of congestive heart failure and hypertension. Generalized right lower leg pain and heel pain. EXAM: CHEST  2 VIEW COMPARISON:  None. FINDINGS: The aorta is tortuous. The heart size is enlarged. There is a 6 mm nodule in the right upper lobe. There is no focal pneumonia, pulmonary edema, or pleural effusion. There is kyphosis of spine. Degenerative joint changes of bilateral shoulders and the spine are identified. IMPRESSION: Cardiomegaly.  No focal abnormality identified within the lungs. 6 mm nodule in the right upper lobe. Further evaluation with a chest CT on outpatient basis is recommended. Electronically Signed   By: Abelardo Diesel M.D.   On: 06/22/2016 17:18   Dg Tibia/fibula Right  Result Date: 06/22/2016 CLINICAL DATA:  Right leg and heel pain. EXAM: RIGHT TIBIA AND FIBULA - 2 VIEW COMPARISON:  None. FINDINGS: There is extensive comminuted displaced fracture of the distal femur. There is  displaced fracture of the proximal fibula. There is probable fracture/ knee replacement loosening of the proximal anterior aspect of the proximal tibia. There is no bony destruction to suggest osteomyelitis. There is anterior ulceration of the lower right anterior leg. IMPRESSION: Fractures of the distal femur, proximal fibula and probable/ knee replacement loosening of the proximal anterior aspect of the proximal tibia. Anterior ulceration of the right lower leg. No evidence of osteomyelitis. Electronically Signed   By: Abelardo Diesel M.D.   On: 06/22/2016 17:25   Dg Foot Complete Right  Result Date: 06/22/2016 CLINICAL DATA:  Ulcers on the right leg and right heel. EXAM: RIGHT FOOT COMPLETE - 3+ VIEW COMPARISON:  None. FINDINGS: There is curvilinear lucency at the base of the third and fourth metatarsals suspicious for fracture. There is cortical discontinuity in the distal aspect of the first metatarsal suspicious for fracture. There is no bony destruction to suggest osteomyelitis. There is diffuse osteopenia. Degenerative joint changes throughout the right foot are noted. IMPRESSION: No evidence of osteomyelitis. Lucency at the base of the  third and fourth metatarsals and in the distal aspect of the first metatarsal suspicious for fracture. Electronically Signed   By: Abelardo Diesel M.D.   On: 06/22/2016 17:21   Dg Femur, Min 2 Views Right  Result Date: 06/24/2016 CLINICAL DATA:  Right distal femur fracture EXAM: RIGHT FEMUR 2 VIEWS COMPARISON:  06/22/2016 right tibia/fibula radiographs. FINDINGS: There is a comminuted periprosthetic fracture in the distal right femoral metaphysis, with 4 cm posterior displacement of the dominant distal fracture fragment. There is an intramedullary rod in the right femoral shaft with interlocking proximal right femoral neck pin and 2 distal interlocking screws, with healed deformity in the mid right femoral shaft in no evidence of hardware fracture or loosening. There is a  right total knee arthroplasty with no dislocation at the right distal femoral and right proximal tibial prosthetic articulation. There is a large enthesophyte at the inferior right patella. No dislocation at the right hip joint. Diffuse osteopenia. Prominent vascular calcifications throughout the soft tissues. No suspicious focal osseous lesions. IMPRESSION: Comminuted periprosthetic fracture in the distal right femoral metaphysis with displacement as described. Postsurgical changes from right total knee arthroplasty and prior ORIF in the right femoral shaft with no evidence of hardware fracture or loosening. Electronically Signed   By: Ilona Sorrel M.D.   On: 06/24/2016 10:05   Dg Knee 2 Views Right  Result Date: 06/24/2016 CLINICAL DATA:  Right distal femur fracture EXAM: RIGHT KNEE - 3 VIEW COMPARISON:  06/22/2016 right tibia/ fibula radiographs. 06/24/2016 right femur radiographs. FINDINGS: Status post right total knee arthroplasty. No dislocation at the articulation of the right distal femoral and right proximal tibial arthroplasty prosthetic components. Partially visualized intramedullary rod with 2 distal interlocking screws in the right distal femoral shaft. Comminuted periprosthetic fracture in the right distal femoral metaphysis with approximately 4 cm posterior displacement of the dominant distal fracture fragment. Diffuse osteopenia. No suspicious focal osseous lesions. Large enthesophyte at the inferior right patella. Vascular calcifications throughout the posterior soft tissues. IMPRESSION: Comminuted periprosthetic fracture in the right distal femoral metaphysis with displacement as described. Postsurgical changes from right total knee arthroplasty and prior ORIF in the right femoral shaft. Diffuse osteopenia. Electronically Signed   By: Ilona Sorrel M.D.   On: 06/24/2016 10:07    Micro Results   Recent Results (from the past 240 hour(s))  Culture, blood (routine x 2)     Status: Abnormal    Collection Time: 06/22/16  5:45 PM  Result Value Ref Range Status   Specimen Description BLOOD LEFT ANTECUBITAL  Final   Special Requests BOTTLES DRAWN AEROBIC AND ANAEROBIC 5CC  Final   Culture  Setup Time   Final    GRAM VARIABLE ROD ANAEROBIC BOTTLE ONLY CRITICAL RESULT CALLED TO, READ BACK BY AND VERIFIED WITH: PHARMD M.MACCIA [id number] MLM    Culture (A)  Final    BACILLUS SPECIES Standardized susceptibility testing for this organism is not available.    Report Status 06/25/2016 FINAL  Final  Blood Culture ID Panel (Reflexed)     Status: None   Collection Time: 06/22/16  5:45 PM  Result Value Ref Range Status   Enterococcus species NOT DETECTED NOT DETECTED Final   Listeria monocytogenes NOT DETECTED NOT DETECTED Final   Staphylococcus species NOT DETECTED NOT DETECTED Final   Staphylococcus aureus NOT DETECTED NOT DETECTED Final   Streptococcus species NOT DETECTED NOT DETECTED Final   Streptococcus agalactiae NOT DETECTED NOT DETECTED Final   Streptococcus pneumoniae NOT DETECTED NOT  DETECTED Final   Streptococcus pyogenes NOT DETECTED NOT DETECTED Final   Acinetobacter baumannii NOT DETECTED NOT DETECTED Final   Enterobacteriaceae species NOT DETECTED NOT DETECTED Final   Enterobacter cloacae complex NOT DETECTED NOT DETECTED Final   Escherichia coli NOT DETECTED NOT DETECTED Final   Klebsiella oxytoca NOT DETECTED NOT DETECTED Final   Klebsiella pneumoniae NOT DETECTED NOT DETECTED Final   Proteus species NOT DETECTED NOT DETECTED Final   Serratia marcescens NOT DETECTED NOT DETECTED Final   Haemophilus influenzae NOT DETECTED NOT DETECTED Final   Neisseria meningitidis NOT DETECTED NOT DETECTED Final   Pseudomonas aeruginosa NOT DETECTED NOT DETECTED Final   Candida albicans NOT DETECTED NOT DETECTED Final   Candida glabrata NOT DETECTED NOT DETECTED Final   Candida krusei NOT DETECTED NOT DETECTED Final   Candida parapsilosis NOT DETECTED NOT DETECTED Final    Candida tropicalis NOT DETECTED NOT DETECTED Final  Wound or Superficial Culture     Status: None   Collection Time: 06/22/16  6:49 PM  Result Value Ref Range Status   Specimen Description LEG RIGHT  Final   Special Requests NONE  Final   Gram Stain   Final    FEW WBC PRESENT, PREDOMINANTLY PMN FEW GRAM POSITIVE COCCI IN PAIRS    Culture   Final    FEW PSEUDOMONAS AERUGINOSA ABUNDANT METHICILLIN RESISTANT STAPHYLOCOCCUS AUREUS    Report Status 06/25/2016 FINAL  Final   Organism ID, Bacteria PSEUDOMONAS AERUGINOSA  Final   Organism ID, Bacteria METHICILLIN RESISTANT STAPHYLOCOCCUS AUREUS  Final      Susceptibility   Methicillin resistant staphylococcus aureus - MIC*    CIPROFLOXACIN >=8 RESISTANT Resistant     ERYTHROMYCIN >=8 RESISTANT Resistant     GENTAMICIN <=0.5 SENSITIVE Sensitive     OXACILLIN >=4 RESISTANT Resistant     TETRACYCLINE <=1 SENSITIVE Sensitive     VANCOMYCIN <=0.5 SENSITIVE Sensitive     TRIMETH/SULFA >=320 RESISTANT Resistant     CLINDAMYCIN <=0.25 SENSITIVE Sensitive     RIFAMPIN <=0.5 SENSITIVE Sensitive     Inducible Clindamycin NEGATIVE Sensitive     * ABUNDANT METHICILLIN RESISTANT STAPHYLOCOCCUS AUREUS   Pseudomonas aeruginosa - MIC*    CEFTAZIDIME 4 SENSITIVE Sensitive     CIPROFLOXACIN <=0.25 SENSITIVE Sensitive     GENTAMICIN <=1 SENSITIVE Sensitive     IMIPENEM <=0.25 SENSITIVE Sensitive     PIP/TAZO 8 SENSITIVE Sensitive     CEFEPIME 2 SENSITIVE Sensitive     * FEW PSEUDOMONAS AERUGINOSA  MRSA PCR Screening     Status: None   Collection Time: 06/22/16  8:52 PM  Result Value Ref Range Status   MRSA by PCR NEGATIVE NEGATIVE Final    Comment:        The GeneXpert MRSA Assay (FDA approved for NASAL specimens only), is one component of a comprehensive MRSA colonization surveillance program. It is not intended to diagnose MRSA infection nor to guide or monitor treatment for MRSA infections.   Culture, blood (routine x 2)     Status: None  (Preliminary result)   Collection Time: 06/22/16  9:13 PM  Result Value Ref Range Status   Specimen Description BLOOD RIGHT ANTECUBITAL  Final   Special Requests IN PEDIATRIC BOTTLE 1.5CC  Final   Culture NO GROWTH 4 DAYS  Final   Report Status PENDING  Incomplete       Today   Subjective    Cindy Mills today has No new complaints today,    last bowel  movement 05/26/2017, patient is eager to go to rehabilitation facility       Patient has been seen and examined prior to discharge   Objective   Blood pressure (!) 131/53, pulse 67, temperature 98.1 F (36.7 C), temperature source Oral, resp. rate 19, height 5\' 6"  (1.676 m), weight 83.3 kg (183 lb 10.3 oz), SpO2 98 %.   Intake/Output Summary (Last 24 hours) at 06/27/16 1129 Last data filed at 06/27/16 1051  Gross per 24 hour  Intake             1770 ml  Output                0 ml  Net             1770 ml    Exam Gen:- Awake  In no apparent distress , patient is seen with RN at bedside HEENT:- [place].AT,   Neck-Supple Neck,No JVD,  Lungs- mostly clear  CV- S1, S2 normal Abd-  +ve B.Sounds, Abd Soft, No tenderness,    Extremity/Skin: Right lower extremity anterior wound with pink granulating base, some foul-smelling drainage noted, santyl ointment/dressing applied, right lower extremity heel and posterior leg wounds with loss of necrotic nonviable tissue, the heel and posterior leg wounds and unstageable.   Right lower extremity swollen, and knee swelling also,  leg is externally rotated   Data Review   CBC w Diff:  Lab Results  Component Value Date   WBC 16.2 (H) 06/27/2016   HGB 10.2 (L) 06/27/2016   HCT 32.7 (L) 06/27/2016   PLT 343 06/27/2016   LYMPHOPCT 7 06/22/2016   MONOPCT 11 06/22/2016   EOSPCT 1 06/22/2016   BASOPCT 0 06/22/2016    CMP:  Lab Results  Component Value Date   NA 138 06/27/2016   K 4.2 06/27/2016   CL 112 (H) 06/27/2016   CO2 17 (L) 06/27/2016   BUN 26 (H) 06/27/2016   CREATININE 0.75  06/27/2016   PROT 6.7 06/22/2016   ALBUMIN 2.4 (L) 06/22/2016   BILITOT 0.6 06/22/2016   ALKPHOS 122 06/22/2016   AST 24 06/22/2016   ALT 16 06/22/2016  .   Total Discharge time is about 33 minutes  Karlis Cregg M.D on 06/27/2016 at 11:29 AM  [facility]   Office  [phone]  Dragon dictation system was used to create this note, attempts have been made to correct errors, however presence of uncorrected errors is not a reflection quality of care provided

## 2016-06-28 LAB — CULTURE, BLOOD (ROUTINE X 2): CULTURE: NO GROWTH

## 2016-07-02 ENCOUNTER — Ambulatory Visit (INDEPENDENT_AMBULATORY_CARE_PROVIDER_SITE_OTHER): Payer: Medicare Other | Admitting: Orthopaedic Surgery

## 2016-07-02 ENCOUNTER — Encounter (INDEPENDENT_AMBULATORY_CARE_PROVIDER_SITE_OTHER): Payer: Self-pay | Admitting: Orthopaedic Surgery

## 2016-07-02 DIAGNOSIS — S81001A Unspecified open wound, right knee, initial encounter: Secondary | ICD-10-CM

## 2016-07-02 NOTE — Progress Notes (Signed)
   Office Visit Note   Patient: Cindy Mills           Date of Birth: 1918/05/20           MRN: [id number] Visit Date: 07/02/2016              Requested by: No referring provider defined for this encounter. PCP: No primary care provider on file.   Assessment & Plan: Visit Diagnoses: No diagnosis found.  Plan: Impression is surgical wound dehiscence. We'll try to treat this with local wound care as patient is high risk for surgery with her multiple medical comorbidities and her age. She is artery on doxycycline. I will like to see her back for a wound check in a week. Total face to face encounter time was greater than 25 minutes and over half of this time was spent in counseling and/or coordination of care.   Follow-Up Instructions: No Follow-up on file.   Orders:  No orders of the defined types were placed in this encounter.  No orders of the defined types were placed in this encounter.     Procedures: No procedures performed   Clinical Data: No additional findings.   Subjective: Chief Complaint  Patient presents with  . Right Hip - Drainage from Incision, Pain    Patient follows up today for her pair prosthetic distal femur fracture. She also has a new wound of the surgical incision that was made by the orthopedic surgeon down in [facility]. She denies any constitutional symptoms. Denies any fevers or chills. Denies any worsening of pain.    Review of Systems   Objective: Vital Signs: There were no vitals taken for this visit.  Physical Exam  Ortho Exam Exam of the right knee shows a 1 cm long wound dehiscence of the previous surgical incision that probes down to the fascia. There is serosanguineous fluid. There is no cellulitis. The wound is able to adequately drain. Specialty Comments:  No specialty comments available.  Imaging: No results found.   PMFS History: Patient Active Problem List   Diagnosis Date Noted  . Rt Leg DVT (deep venous thrombosis)   06/27/2016  . Pressure injury of skin 06/23/2016  . AKI (acute kidney injury) ([place]) 06/23/2016  . Acute encephalopathy 06/23/2016  . Leukocytosis 06/23/2016  . History of CHF (congestive heart failure) 06/23/2016  . Anemia 06/23/2016  . Acute cystitis without hematuria   . Acute renal failure ([place])   . 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 Closed fracture of right femur ([place])   . Deep vein thrombosis (DVT) of left lower extremity ([place])   . Essential hypertension   . Hyperkalemia 06/22/2016  . UTI (urinary tract infection) 06/22/2016   Past Medical History:  Diagnosis Date  . Breast cancer ([place])   . Cancer ([place])   . CHF (congestive heart failure) ([place])   . Hypertension     No family history on file.  Past Surgical History:  Procedure Laterality Date  . BREAST LUMPECTOMY    . REPLACEMENT TOTAL KNEE BILATERAL     Social History   Occupational History  . Not on file.   Social History Main Topics  . Smoking status: Never Smoker  . Smokeless tobacco: Never Used  . Alcohol use No  . Drug use: No  . Sexual activity: No

## 2016-07-10 ENCOUNTER — Ambulatory Visit (INDEPENDENT_AMBULATORY_CARE_PROVIDER_SITE_OTHER): Payer: Medicare Other | Admitting: Orthopaedic Surgery

## 2016-07-10 ENCOUNTER — Ambulatory Visit (INDEPENDENT_AMBULATORY_CARE_PROVIDER_SITE_OTHER): Payer: Self-pay | Admitting: Orthopaedic Surgery

## 2016-07-13 ENCOUNTER — Ambulatory Visit (INDEPENDENT_AMBULATORY_CARE_PROVIDER_SITE_OTHER): Payer: Medicare Other

## 2016-07-13 ENCOUNTER — Ambulatory Visit (INDEPENDENT_AMBULATORY_CARE_PROVIDER_SITE_OTHER): Payer: Medicare Other | Admitting: Orthopaedic Surgery

## 2016-07-13 DIAGNOSIS — M25561 Pain in right knee: Secondary | ICD-10-CM | POA: Diagnosis not present

## 2016-07-13 DIAGNOSIS — S81001A Unspecified open wound, right knee, initial encounter: Secondary | ICD-10-CM

## 2016-07-13 NOTE — Progress Notes (Signed)
   Office Visit Note   Patient: Naylanie Marentes           Date of Birth: 1918/01/31           MRN: [id number] Visit Date: 07/13/2016              Requested by: No referring provider defined for this encounter. PCP: No primary care provider on file.   Assessment & Plan: Visit Diagnoses:  1. Open knee wound, right, initial encounter     Plan: Wound is healing appropriately. X-ray show stable fracture. We did discuss that the distal femur can possibly erode out the anterior skin which would necessitate surgery but for now we'll continue with nonoperative treatment per the family and patient's wishes.  Follow-Up Instructions: Return in about 4 weeks (around 08/10/2016).   Orders:  Orders Placed This Encounter  Procedures  . XR Knee 1-2 Views Right   No orders of the defined types were placed in this encounter.     Procedures: No procedures performed   Clinical Data: No additional findings.   Subjective: No chief complaint on file.   Patient comes back today for wound check of her right knee. She denies any pain.    Review of Systems   Objective: Vital Signs: There were no vitals taken for this visit.  Physical Exam  Ortho Exam Exam of the right knee wound shows improvement. There is no drainage. The hole seems to be smaller. There is iodoform packing. Specialty Comments:  No specialty comments available.  Imaging: Xr Knee 1-2 Views Right  Result Date: 07/13/2016 Stable distal femur fracture    PMFS History: Patient Active Problem List   Diagnosis Date Noted  . Open knee wound, right, initial encounter 07/02/2016  . Rt Leg DVT (deep venous thrombosis)  06/27/2016  . Pressure injury of skin 06/23/2016  . AKI (acute kidney injury) ([place]) 06/23/2016  . Acute encephalopathy 06/23/2016  . Leukocytosis 06/23/2016  . History of CHF (congestive heart failure) 06/23/2016  . Anemia 06/23/2016  . Acute cystitis without hematuria   . Acute renal failure ([place])   .  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 Closed fracture of right femur ([place])   . Deep vein thrombosis (DVT) of left lower extremity ([place])   . Essential hypertension   . Hyperkalemia 06/22/2016  . UTI (urinary tract infection) 06/22/2016   Past Medical History:  Diagnosis Date  . Breast cancer ([place])   . Cancer ([place])   . CHF (congestive heart failure) ([place])   . Hypertension     No family history on file.  Past Surgical History:  Procedure Laterality Date  . BREAST LUMPECTOMY    . REPLACEMENT TOTAL KNEE BILATERAL     Social History   Occupational History  . Not on file.   Social History Main Topics  . Smoking status: Never Smoker  . Smokeless tobacco: Never Used  . Alcohol use No  . Drug use: No  . Sexual activity: No

## 2016-07-24 ENCOUNTER — Encounter: Payer: Self-pay | Admitting: Surgery

## 2016-07-29 ENCOUNTER — Encounter: Payer: Self-pay | Admitting: Surgery

## 2016-07-29 ENCOUNTER — Ambulatory Visit (INDEPENDENT_AMBULATORY_CARE_PROVIDER_SITE_OTHER): Payer: Medicare Other | Admitting: Surgery

## 2016-07-29 ENCOUNTER — Ambulatory Visit (HOSPITAL_COMMUNITY)
Admission: RE | Admit: 2016-07-29 | Discharge: 2016-07-29 | Disposition: A | Discharge status: Home / Self Care | Payer: Medicare Other | Source: Ambulatory Visit | Attending: Surgery | Admitting: Surgery

## 2016-07-29 VITALS — BP 87/51 | HR 58 | Temp 98.9°F | Resp 18 | Ht 66.0 in | Wt 183.0 lb

## 2016-07-29 DIAGNOSIS — I7025 Atherosclerosis of native arteries of other extremities with ulceration: Secondary | ICD-10-CM

## 2016-07-29 DIAGNOSIS — I70238 Atherosclerosis of native arteries of right leg with ulceration of other part of lower right leg: Secondary | ICD-10-CM

## 2016-07-29 NOTE — Progress Notes (Signed)
[facility]ist [facility]  Patient name: Cindy Mills MRN: [id number] DOB: March 08, 1918 Sex: female   REFERRING PROVIDER:    [place] FOR CONSULT:    Right Leg Ulcer  HISTORY OF PRESENT ILLNESS:   Cindy Mills is a 83 y.o. female with medical history significant of HTN, CHF, breast cancer, and dvt; who presented with altered mental status. Family who is present at bedside provide history as patient is currently confused. Patient was previously being cared for at [facility] assisted living facility. Patient had fallen at Thanksgiving and fractured her right leg. During the hospitalization at [facility] she was also found to have DVT for which it appears that the patient was not placed on any anticoagulation. She was evaluated by orthopedic surgery, but was unable to have surgical correction due to issues with the right knee. Following the hospitalization she was transferred back to [facility] to the nursing home part, and was placed on pain medications tramadol and fentanyl. Family notes that the facility continue to increase dosage of these medications and as a result the patient had declining function, mental status, and issues with dehydration. They requested multiple times for pain medications to be decreased. Family after evaluating overall care decided to transfer her from [facility] to [place] home.  She ended up [facility] the [facility] department she was found to have a UTI.  She also improve her mental status with administration of Narcan as she had been receiving chronic pain medications.  The patient is on Eliquis for her DVT.  She has been offered an above-knee amputation to deal with her right leg however has refused.  He is here today for discussions regarding treatment of her right leg ulcer.  PAST MEDICAL HISTORY    Past Medical History:  Diagnosis Date  .  Breast cancer ([place])   . Cancer ([place])   . CHF (congestive heart failure) ([place])   . Hypertension      FAMILY HISTORY   No family history on file.  SOCIAL HISTORY:   Social History   Social History  . Marital status: Widowed    Spouse name: N/A  . Number of children: N/A  . Years of education: N/A   Occupational History  . Not on file.   Social History Main Topics  . Smoking status: Never Smoker  . Smokeless tobacco: Never Used  . Alcohol use No  . Drug use: No  . Sexual activity: No   Other Topics Concern  . Not on file   Social History Narrative  . No narrative on file    ALLERGIES:    Allergies  Allergen Reactions  . Penicillins Anaphylaxis    Family does not have any additional information  . Amoxicillin Other (See Comments)    Unknown reaction per MAR  . Sulfa Antibiotics Other (See Comments)    CURRENT MEDICATIONS:    Current Outpatient Prescriptions  Medication Sig Dispense Refill  . acetaminophen (TYLENOL) 325 MG tablet Take 2 tablets (650 mg total) by mouth every 6 (six) hours as needed for mild pain (or Fever >/= 101). 30 tablet 0  . albuterol (PROVENTIL) (2.5 MG/3ML) 0.083% nebulizer solution Take 3 mLs (2.5 mg total) by nebulization every 4 (four) hours as needed for wheezing. 75 mL 12  . alendronate (FOSAMAX) 70 MG tablet Take 70 mg by mouth every Sunday. Take with a full glass of water on an empty  stomach.    . Amino Acids-Protein Hydrolys (FEEDING SUPPLEMENT, PRO-STAT SUGAR FREE 64,) LIQD Take 30 mLs by mouth 3 (three) times daily with meals.    Marland Kitchen amLODipine (NORVASC) 10 MG tablet Take 1 tablet (10 mg total) by mouth daily. 30 tablet 0  . apixaban (ELIQUIS) 5 MG TABS tablet Take 1 tablet (5 mg total) by mouth 2 (two) times daily. Rt Leg DVT 60 tablet 1  . carvedilol (COREG) 3.125 MG tablet Take 1 tablet (3.125 mg total) by mouth 2 (two) times daily with a meal. 60 tablet 1  . cholecalciferol (VITAMIN D) 1000 units tablet Take 1,000 Units by  mouth daily.    . ciprofloxacin (CIPRO) 500 MG tablet Take 1 tablet (500 mg total) by mouth 2 (two) times daily. 20 tablet 0  . cloNIDine (CATAPRES - DOSED IN MG/24 HR) 0.2 mg/24hr patch Place 0.2 mg onto the skin every Thursday.    Marland Kitchen doxycycline (VIBRA-TABS) 100 MG tablet Take 1 tablet (100 mg total) by mouth 2 (two) times daily. 20 tablet 0  . fluticasone (FLOVENT DISKUS) 50 MCG/BLIST diskus inhaler Inhale 2 puffs into the lungs every 12 (twelve) hours. Rinse mouth with water after use. Do not swallow.    . furosemide (LASIX) 20 MG tablet Take 20 mg by mouth daily.    Marland Kitchen guaiFENesin (MUCINEX) 600 MG 12 hr tablet Take 600 mg by mouth every 12 (twelve) hours.    Marland Kitchen lisinopril (PRINIVIL,ZESTRIL) 10 MG tablet Take 10 mg by mouth See admin instructions. Take 1 tablet (10 mg) by mouth every 12 hours - hold for SBP <110    . megestrol (MEGACE) 400 MG/10ML suspension Take 10 mLs (400 mg total) by mouth daily. 240 mL 0  . Multiple Vitamin (MULTIVITAMIN WITH MINERALS) TABS tablet Take 1 tablet by mouth daily.    Marland Kitchen omeprazole (PRILOSEC) 20 MG capsule Take 20 mg by mouth daily at 6 (six) AM.    . ondansetron (ZOFRAN) 4 MG tablet Take 1 tablet (4 mg total) by mouth every 6 (six) hours as needed for nausea. 20 tablet 0  . ondansetron (ZOFRAN-ODT) 4 MG disintegrating tablet Take 4 mg by mouth every 4 (four) hours as needed for nausea or vomiting.    Marland Kitchen oxybutynin (DITROPAN-XL) 5 MG 24 hr tablet Take 5 mg by mouth daily.    . Probiotic Product (PROBIOTIC PO) Take 1 capsule by mouth every 12 (twelve) hours. Probiotic Formula Veggie Caps 1B-250 mg    . promethazine (PHENERGAN) 25 MG suppository Place 25 mg rectally every 4 (four) hours as needed for nausea or vomiting.    . vitamin C (ASCORBIC ACID) 500 MG tablet Take 500 mg by mouth every 12 (twelve) hours.     No current facility-administered medications for this visit.     REVIEW OF SYSTEMS:   [X]  denotes positive finding, [ ]  denotes negative  finding Cardiac  Comments:  Chest pain or chest pressure:    Shortness of breath upon exertion:    Short of breath when lying flat:    Irregular heart rhythm:        Vascular    Pain in calf, thigh, or hip brought on by ambulation:    Pain in feet at night that wakes you up from your sleep:     Blood clot in your veins:    Leg swelling:         Pulmonary    Oxygen at home:    Productive cough:  Wheezing:         Neurologic    Sudden weakness in arms or legs:     Sudden numbness in arms or legs:     Sudden onset of difficulty speaking or slurred speech:    Temporary loss of vision in one eye:     Problems with dizziness:         Gastrointestinal    Blood in stool:      Vomited blood:         Genitourinary    Burning when urinating:     Blood in urine:        Psychiatric    Major depression:         Hematologic    Bleeding problems:    Problems with blood clotting too easily:        Skin    Rashes or ulcers:        Constitutional    Fever or chills:     PHYSICAL EXAM:   There were no vitals filed for this visit.  GENERAL: The patient is a well-nourished female, in no acute distress. The vital signs are documented above. CARDIAC: There is a regular rate and rhythm.  VASCULAR: Pulses are not palpable.  Positive edema to the right leg PULMONARY: Nonlabored respirations ABDOMEN: Soft and non-tender with normal pitched bowel sounds.  MUSCULOSKELETAL: There are no major deformities or cyanosis. NEUROLOGIC: No focal weakness or paresthesias are detected. SKIN: Large ulcer on the posterior lateral side of the right leg healthy-appearing granulation tissue as well as tendon is exposed. PSYCHIATRIC: The patient has a normal affect.  STUDIES:   06-24-2016:  LE Duplex - acute DVT involving the Right popliteal vein and gastroc veins.   Toe pressure on the right is 60 to  ASSESSMENT and PLAN   Right lower extremity wound: I feel that this wound is multifactorial.   She definitely has evidence of arterial insufficiency.  In addition edema and pressure applied laying the largest part in her wound.  She defervesced and needs to have pressure offloading.  She was benefit from elevation to help with the edema.  I have contemplated angiography to try and improve her blood flow, however she is here alone with no family to help discuss this.  In addition she has refused an amputation by orthopedics stating that she does not want any more procedures done.  Therefore, I'm going to send her to the wound center for management of her wound.  She will follow-up with me in one month.  I have requested that she have her son accompany her so we can discuss whether or not any vascular evaluation is needed.   Annamarie Major, MD [facility] [phone] Pager [phone]

## 2016-08-10 ENCOUNTER — Ambulatory Visit (INDEPENDENT_AMBULATORY_CARE_PROVIDER_SITE_OTHER): Payer: 59 | Admitting: Orthopaedic Surgery

## 2016-08-13 ENCOUNTER — Ambulatory Visit (INDEPENDENT_AMBULATORY_CARE_PROVIDER_SITE_OTHER): Payer: Medicare Other | Admitting: Orthopaedic Surgery

## 2016-08-13 ENCOUNTER — Encounter (INDEPENDENT_AMBULATORY_CARE_PROVIDER_SITE_OTHER): Payer: Self-pay | Admitting: Orthopaedic Surgery

## 2016-08-13 DIAGNOSIS — I70238 Atherosclerosis of native arteries of right leg with ulceration of other part of lower right leg: Secondary | ICD-10-CM

## 2016-08-13 DIAGNOSIS — S81001A Unspecified open wound, right knee, initial encounter: Secondary | ICD-10-CM

## 2016-08-13 NOTE — Progress Notes (Signed)
   Office Visit Note   Patient: Cindy Mills           Date of Birth: 1917/10/12           MRN: [id number] Visit Date: 08/13/2016              Requested by: Serafina Mitchell, MD [place], [place] [address] PCP: Henrine Screws, MD   Assessment & Plan: Visit Diagnoses:  1. Open knee wound, right, initial encounter     Plan: we will treat her femur fx nonop with permanent KI or if femur erodes out of the skin that she would need AKA.  Again discussed surgical options at this time and they want to continue with nonop treatment.  F/u prn.  Follow-Up Instructions: Return if symptoms worsen or fail to improve.   Orders:  No orders of the defined types were placed in this encounter.  No orders of the defined types were placed in this encounter.     Procedures: No procedures performed   Clinical Data: No additional findings.   Subjective: Chief Complaint  Patient presents with  . Right Knee - Pain, Follow-up    Patient f/u today for wound check.  The wound has healed.  Denies any pain when in [place].    Review of Systems   Objective: Vital Signs: There were no vitals taken for this visit.  Physical Exam  Ortho Exam Open wound has healed nicely.  No signs of infection Specialty Comments:  No specialty comments available.  Imaging: No results found.   PMFS History: Patient Active Problem List   Diagnosis Date Noted  . Open knee wound, right, initial encounter 07/02/2016  . Rt Leg DVT (deep venous thrombosis)  06/27/2016  . Pressure injury of skin 06/23/2016  . AKI (acute kidney injury) ([place]) 06/23/2016  . Acute encephalopathy 06/23/2016  . Leukocytosis 06/23/2016  . History of CHF (congestive heart failure) 06/23/2016  . Anemia 06/23/2016  . Acute cystitis without hematuria   . Acute renal failure ([place])   . 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 Closed fracture of right femur ([place])   . Deep vein  thrombosis (DVT) of left lower extremity ([place])   . Essential hypertension   . Hyperkalemia 06/22/2016  . UTI (urinary tract infection) 06/22/2016   Past Medical History:  Diagnosis Date  . Anemia   . Breast cancer ([employer])   . Cancer ([place])   . CHF (congestive heart failure) ([place])   . Hypertension   . Peripheral vascular disease ([place])     No family history on file.  Past Surgical History:  Procedure Laterality Date  . BREAST LUMPECTOMY    . REPLACEMENT TOTAL KNEE BILATERAL     Social History   Occupational History  . Not on file.   Social History Main Topics  . Smoking status: Never Smoker  . Smokeless tobacco: Never Used  . Alcohol use No  . Drug use: No  . Sexual activity: No

## 2016-08-28 ENCOUNTER — Encounter: Payer: Self-pay | Admitting: Surgery

## 2016-08-31 ENCOUNTER — Telehealth: Payer: Self-pay

## 2016-08-31 DIAGNOSIS — I70238 Atherosclerosis of native arteries of right leg with ulceration of other part of lower right leg: Secondary | ICD-10-CM

## 2016-08-31 NOTE — Telephone Encounter (Signed)
Rec'd call from son and daughter-in-law.  Requested results of the ultrasound done 1 mo. ago.  Noted the ABI's were not able to be determined due to the pt. c/o pain, and inability to hold still.  The son questioned if the study would be repeated at the f/u appt. Next Monday, 3/19?  Discussed with Dr. Trula Slade.  Advised to schedule for ABI's with appt. 3/19.  Will have a Scheduler call with updated appt. info.

## 2016-09-01 NOTE — Telephone Encounter (Signed)
I am trying to make room for a ABI for this pt

## 2016-09-07 ENCOUNTER — Ambulatory Visit: Payer: Medicare Other | Admitting: Surgery

## 2016-09-07 ENCOUNTER — Encounter (HOSPITAL_COMMUNITY): Payer: Medicare Other

## 2017-01-21 DIAGNOSIS — R001 Bradycardia, unspecified: Secondary | ICD-10-CM | POA: Diagnosis not present

## 2017-01-21 DIAGNOSIS — I219 Acute myocardial infarction, unspecified: Secondary | ICD-10-CM

## 2017-02-20 DEATH — deceased

## 2017-05-25 IMAGING — CR DG FEMUR 2+V*R*
4 series · 4 of 4 positions shown · non-contrast
Comparison: 06/22/2016 right tibia/fibula radiographs.

CLINICAL DATA: Right distal femur fracture

EXAM:
RIGHT FEMUR 2 VIEWS

[femur ap (1 of 2)]
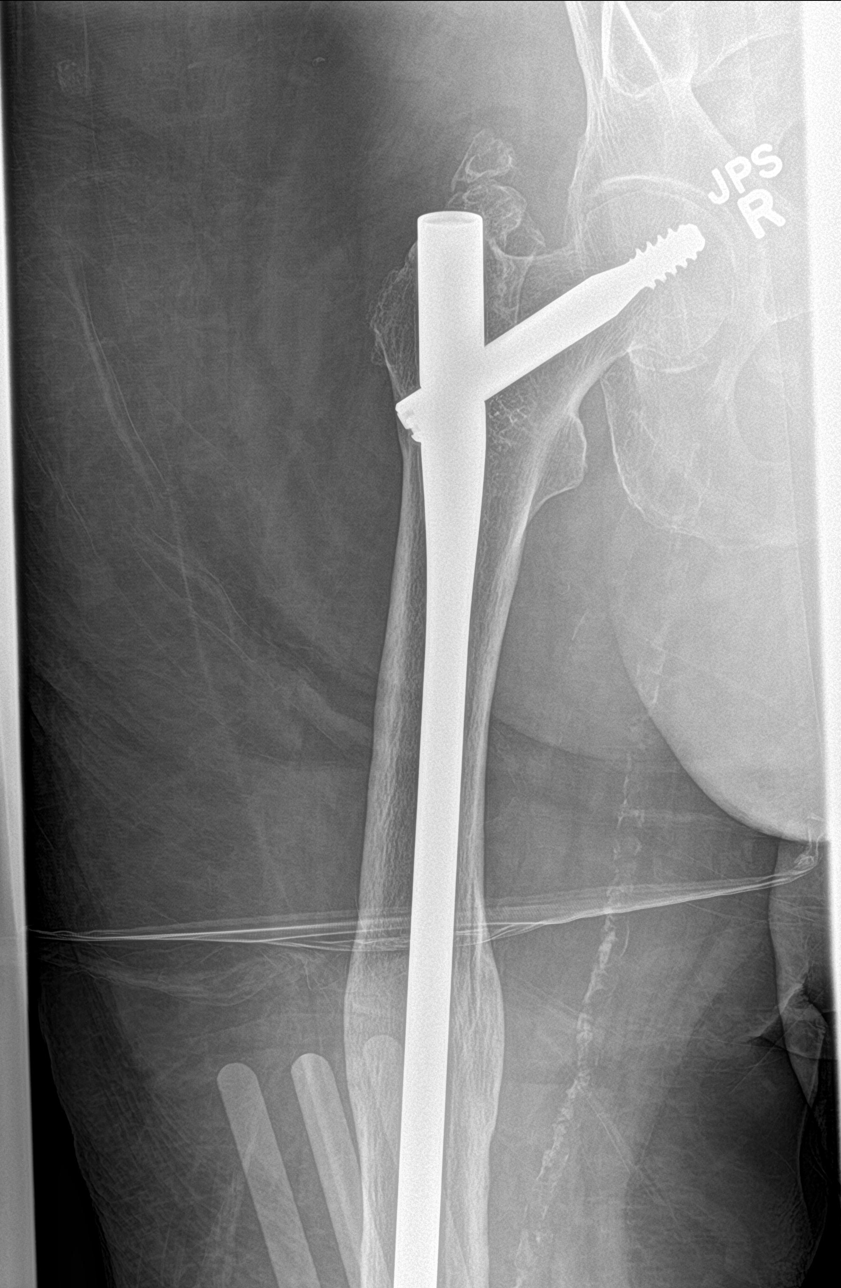

[femur ap (2 of 2)]
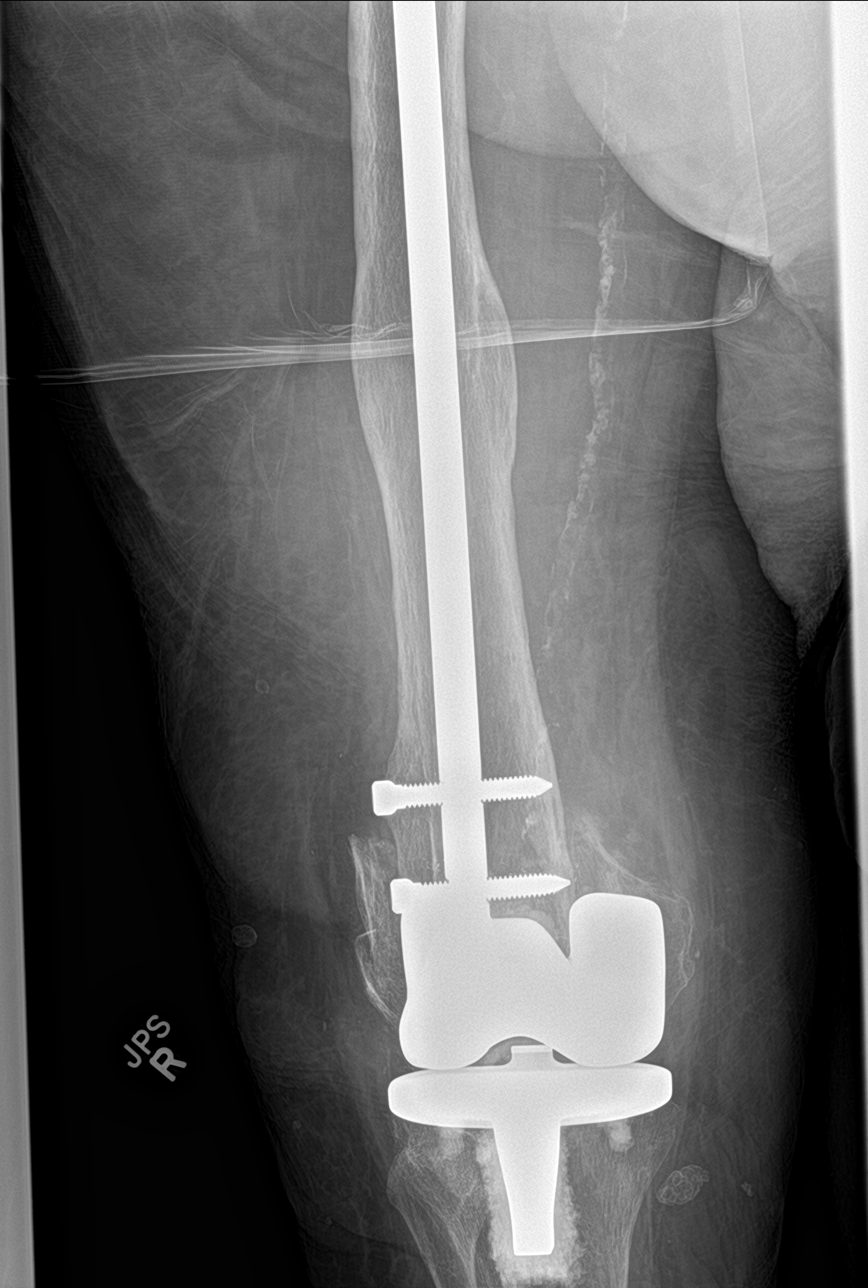

[femur lat]
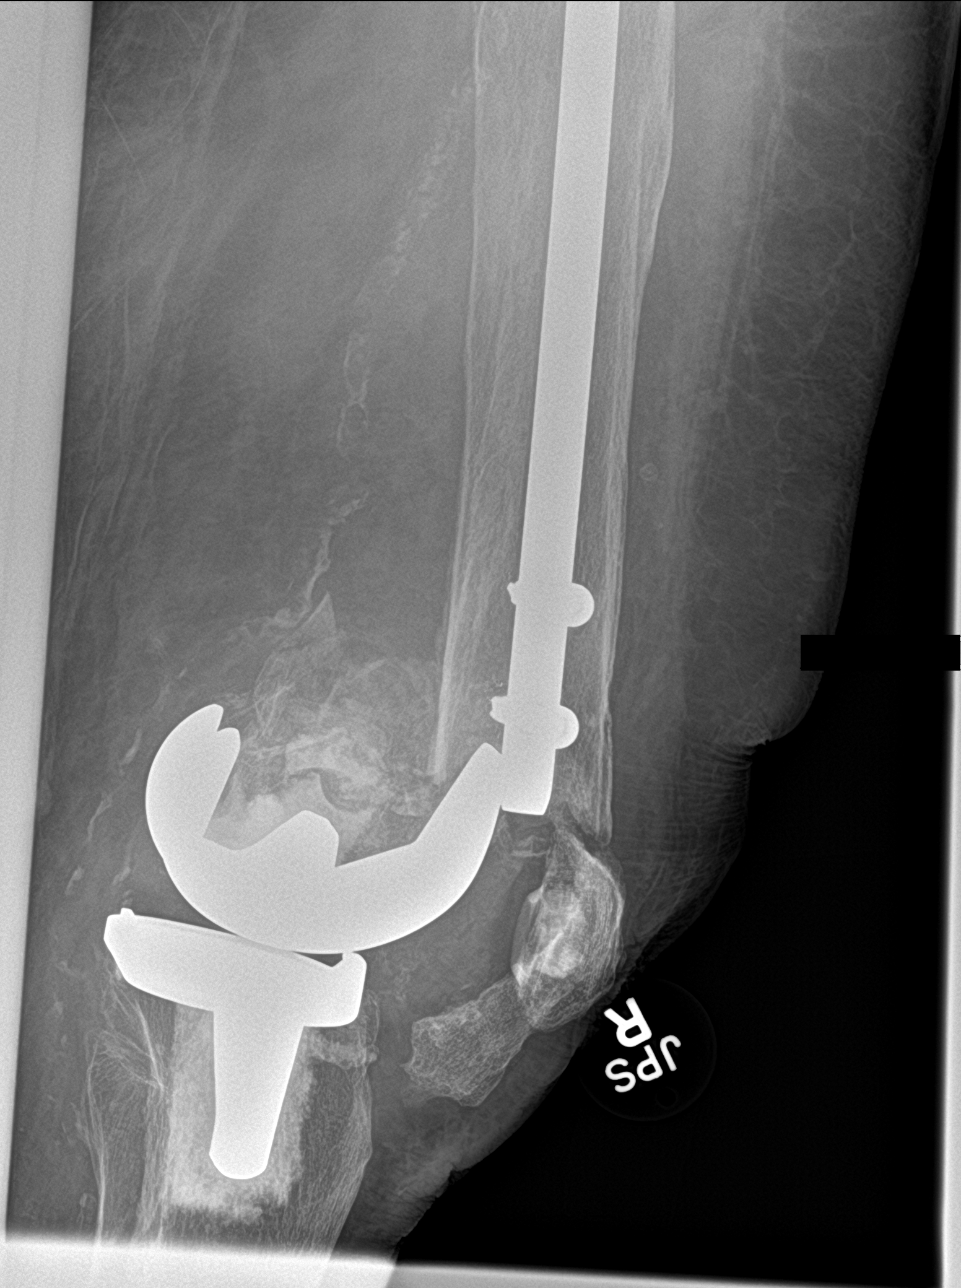

[hip x-table]
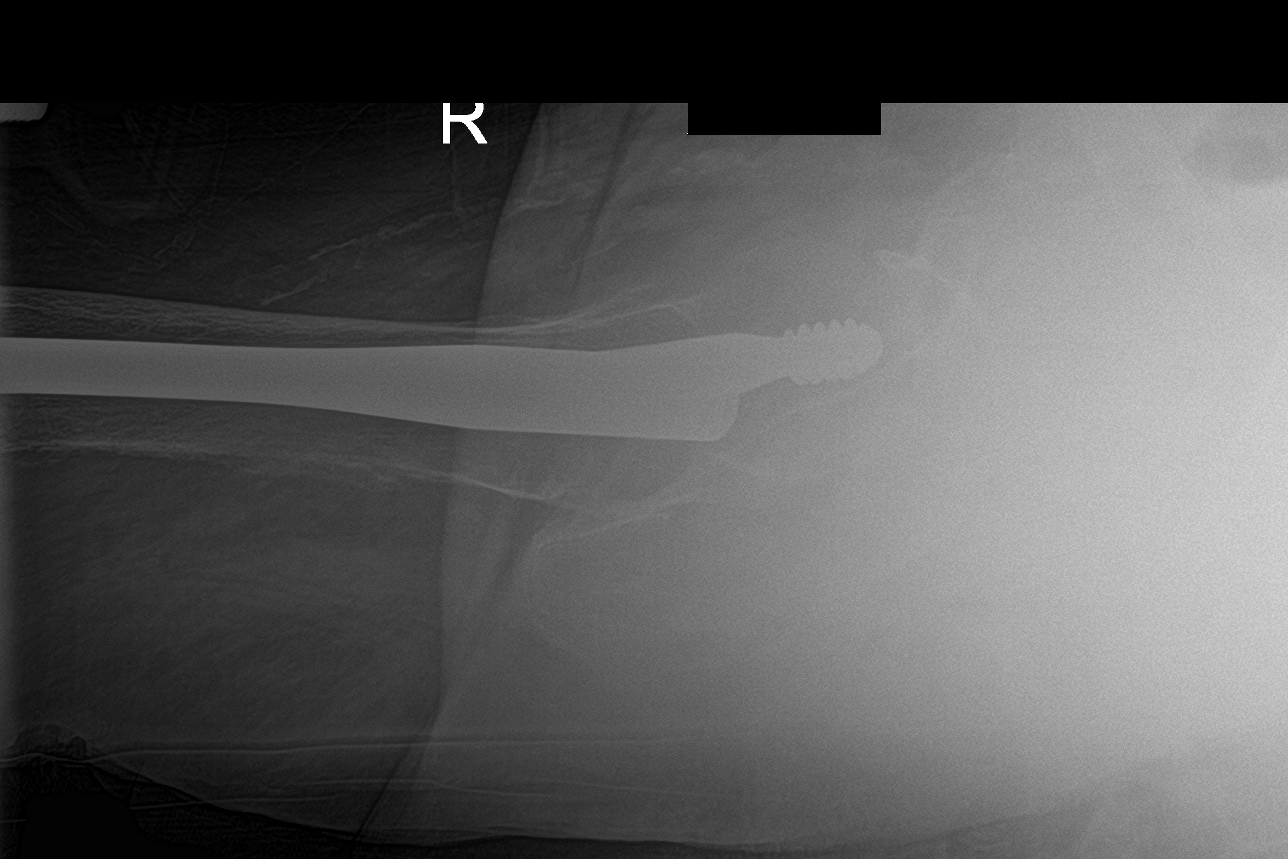

[4 of 4 positions shown; findings below may reference images not displayed]

FINDINGS: There is a comminuted periprosthetic fracture in the distal right
femoral metaphysis, with 4 cm posterior displacement of the dominant
distal fracture fragment. There is an intramedullary rod in the
right femoral shaft with interlocking proximal right femoral neck
pin and 2 distal interlocking screws, with healed deformity in the
mid right femoral shaft in no evidence of hardware fracture or
loosening. There is a right total knee arthroplasty with no
dislocation at the right distal femoral and right proximal tibial
prosthetic articulation. There is a large enthesophyte at the
inferior right patella. No dislocation at the right hip joint.
Diffuse osteopenia. Prominent vascular calcifications throughout the
soft tissues. No suspicious focal osseous lesions.
IMPRESSION: Comminuted periprosthetic fracture in the distal right femoral
metaphysis with displacement as described.

Postsurgical changes from right total knee arthroplasty and prior
ORIF in the right femoral shaft with no evidence of hardware
fracture or loosening.
